# Patient Record
Sex: Male | Born: 1985
Health system: Southern US, Community
[De-identification: ages and names within clinical notes are randomized; demographics above are authoritative.]

## PROBLEM LIST (undated history)

## (undated) DIAGNOSIS — M25512 Pain in left shoulder: Secondary | ICD-10-CM

## (undated) DIAGNOSIS — Z9889 Other specified postprocedural states: Secondary | ICD-10-CM

## (undated) DIAGNOSIS — K219 Gastro-esophageal reflux disease without esophagitis: Secondary | ICD-10-CM

## (undated) DIAGNOSIS — G4730 Sleep apnea, unspecified: Secondary | ICD-10-CM

## (undated) DIAGNOSIS — R519 Headache, unspecified: Secondary | ICD-10-CM

## (undated) DIAGNOSIS — Z8719 Personal history of other diseases of the digestive system: Secondary | ICD-10-CM

## (undated) DIAGNOSIS — R51 Headache: Principal | ICD-10-CM

## (undated) HISTORY — PX: ESOPHAGEAL DILATION: SHX303

## (undated) HISTORY — DX: Personal history of other diseases of the digestive system: Z87.19

## (undated) HISTORY — PX: LIPOMA EXCISION: SHX5283

## (undated) HISTORY — PX: SHOULDER ARTHROSCOPY: SHX128

## (undated) HISTORY — DX: Headache, unspecified: R51.9

## (undated) HISTORY — DX: Pain in left shoulder: M25.512

## (undated) HISTORY — DX: Sleep apnea, unspecified: G47.30

## (undated) HISTORY — DX: Headache: R51

## (undated) HISTORY — DX: Gastro-esophageal reflux disease without esophagitis: K21.9

## (undated) HISTORY — DX: Other specified postprocedural states: Z98.890

---

## 2005-12-07 ENCOUNTER — Ambulatory Visit: Payer: Self-pay | Admitting: Urology

## 2006-01-09 ENCOUNTER — Emergency Department: Payer: Self-pay | Admitting: Emergency Medicine

## 2007-10-04 ENCOUNTER — Ambulatory Visit: Payer: Self-pay | Admitting: Internal Medicine

## 2007-10-16 ENCOUNTER — Ambulatory Visit: Payer: Self-pay | Admitting: Family Medicine

## 2009-10-22 ENCOUNTER — Ambulatory Visit: Payer: Self-pay | Admitting: Family Medicine

## 2011-02-08 ENCOUNTER — Ambulatory Visit: Payer: Self-pay | Admitting: Family Medicine

## 2012-03-08 ENCOUNTER — Other Ambulatory Visit: Payer: Self-pay | Admitting: Neurosurgery

## 2012-03-08 DIAGNOSIS — M5124 Other intervertebral disc displacement, thoracic region: Secondary | ICD-10-CM

## 2012-03-20 ENCOUNTER — Ambulatory Visit
Admission: RE | Admit: 2012-03-20 | Discharge: 2012-03-20 | Disposition: A | Discharge status: Home / Self Care | Payer: BC Managed Care – PPO | Source: Ambulatory Visit | Attending: Neurosurgery | Admitting: Neurosurgery

## 2012-03-20 VITALS — BP 144/77 | HR 82

## 2012-03-20 DIAGNOSIS — M5124 Other intervertebral disc displacement, thoracic region: Secondary | ICD-10-CM

## 2012-03-20 MED ORDER — ONDANSETRON HCL 4 MG/2ML IJ SOLN
4.0000 mg | Freq: Once | INTRAMUSCULAR | Status: AC
  Administered 2012-03-20: 4 mg via INTRAMUSCULAR

## 2012-03-20 MED ORDER — DIAZEPAM 5 MG PO TABS
10.0000 mg | ORAL_TABLET | Freq: Once | ORAL | Status: AC
  Administered 2012-03-20: 10 mg via ORAL

## 2012-03-20 MED ORDER — MEPERIDINE HCL 100 MG/ML IJ SOLN
75.0000 mg | Freq: Once | INTRAMUSCULAR | Status: AC
  Administered 2012-03-20: 75 mg via INTRAMUSCULAR

## 2012-03-20 MED ORDER — IOHEXOL 300 MG/ML  SOLN
10.0000 mL | Freq: Once | INTRAMUSCULAR | Status: AC | PRN
  Administered 2012-03-20: 10 mL via INTRATHECAL

## 2012-05-02 ENCOUNTER — Other Ambulatory Visit: Payer: Self-pay | Admitting: Internal Medicine

## 2012-05-02 DIAGNOSIS — R1310 Dysphagia, unspecified: Secondary | ICD-10-CM

## 2012-05-09 ENCOUNTER — Inpatient Hospital Stay: Admission: RE | Admit: 2012-05-09 | Payer: BC Managed Care – PPO | Source: Ambulatory Visit

## 2013-03-06 ENCOUNTER — Emergency Department: Payer: Self-pay | Admitting: Emergency Medicine

## 2013-03-06 LAB — CBC
HCT: 41.1 % (ref 40.0–52.0)
HGB: 14.6 g/dL (ref 13.0–18.0)
MCH: 32 pg (ref 26.0–34.0)
MCV: 90 fL (ref 80–100)
Platelet: 192 10*3/uL (ref 150–440)
RDW: 12.7 % (ref 11.5–14.5)
WBC: 9.3 10*3/uL (ref 3.8–10.6)

## 2013-03-06 LAB — BASIC METABOLIC PANEL
BUN: 12 mg/dL (ref 7–18)
EGFR (Non-African Amer.): >60
Glucose: 107 mg/dL — ABNORMAL HIGH (ref 65–99)
Osmolality: 278 (ref 275–301)
Potassium: 3.5 mmol/L (ref 3.5–5.1)

## 2013-03-06 LAB — URINALYSIS, COMPLETE
Bilirubin,UR: NEGATIVE
Leukocyte Esterase: NEGATIVE
Nitrite: NEGATIVE
Ph: 7 (ref 4.5–8.0)
Protein: NEGATIVE
RBC,UR: 9 /HPF (ref 0–5)
WBC UR: 1 /HPF (ref 0–5)

## 2013-04-07 ENCOUNTER — Emergency Department: Payer: Self-pay | Admitting: Emergency Medicine

## 2013-04-07 LAB — BASIC METABOLIC PANEL
BUN: 15 mg/dL (ref 7–18)
Calcium, Total: 9.9 mg/dL (ref 8.5–10.1)
EGFR (African American): >60
Osmolality: 283 (ref 275–301)

## 2013-04-07 LAB — URINALYSIS, COMPLETE
Bacteria: NONE SEEN
Leukocyte Esterase: NEGATIVE
RBC,UR: 3 /HPF (ref 0–5)
Specific Gravity: 1.025 (ref 1.003–1.030)
Squamous Epithelial: NONE SEEN
WBC UR: 2 /HPF (ref 0–5)

## 2013-04-07 LAB — CBC
HCT: 42.6 % (ref 40.0–52.0)
HGB: 15.1 g/dL (ref 13.0–18.0)
MCH: 31.9 pg (ref 26.0–34.0)
MCHC: 35.5 g/dL (ref 32.0–36.0)
Platelet: 186 10*3/uL (ref 150–440)
RBC: 4.74 10*6/uL (ref 4.40–5.90)
RDW: 12.4 % (ref 11.5–14.5)
WBC: 8.1 10*3/uL (ref 3.8–10.6)

## 2014-02-26 ENCOUNTER — Ambulatory Visit: Payer: Self-pay | Admitting: Neurosurgery

## 2015-06-16 ENCOUNTER — Ambulatory Visit: Payer: Self-pay | Admitting: Neurology

## 2015-07-01 ENCOUNTER — Encounter: Payer: Self-pay | Admitting: Neurology

## 2015-07-01 ENCOUNTER — Ambulatory Visit (INDEPENDENT_AMBULATORY_CARE_PROVIDER_SITE_OTHER): Payer: PRIVATE HEALTH INSURANCE | Admitting: Neurology

## 2015-07-01 VITALS — BP 139/87 | HR 97 | Ht 70.0 in | Wt 221.0 lb

## 2015-07-01 DIAGNOSIS — G4486 Cervicogenic headache: Secondary | ICD-10-CM

## 2015-07-01 DIAGNOSIS — R51 Headache: Secondary | ICD-10-CM | POA: Diagnosis not present

## 2015-07-01 HISTORY — DX: Cervicogenic headache: G44.86

## 2015-07-01 MED ORDER — GABAPENTIN 300 MG PO CAPS: 300.0000 mg | ORAL_CAPSULE | Freq: Two times a day (BID) | ORAL | Status: DC

## 2015-07-01 NOTE — Progress Notes (Signed)
Reason for visit: Headache  Referring physician: Dr. Martha Maldonado is a 30 y.o. male  History of present illness:  [name] is a 30 year old left-handed white male with a history of headaches that began about one year ago. The patient indicates that he has had some left shoulder discomfort, he was seen by Dr. Thurston Maldonado for this, he was not felt to have significant intrinsic shoulder joint disease. He has noted some discomfort in the paraspinal muscles on the left, with some crepitus in the neck. He reports discomfort in the left occipital area of the head, occasionally radiating into the left frontotemporal area. He will note some restriction of movement when he turns his head to the right, with a pulling sensation in the left neck. When he turns his head to the left for a prolonged period of time, this will significantly increase the pain in the left occipital region. He denies any nausea or vomiting with the headache, but he does have some dizziness and some blurring of vision at times. The headache may vary in severity from one day to the next. The headache is always there. He has been placed on low-dose Flexeril 5 mg at night with minimal benefit. He will take Tylenol on occasion. He denies any weakness of the extremities, he denies any gait disturbance or difficulty controlling the bowels or the bladder. He is sent to this office for an evaluation.  Past Medical History  Diagnosis Date  . Headache   . Cervicogenic headache 07/01/2015    Left side    Past Surgical History  Procedure Laterality Date  . Lipoma excision      Family History  Problem Relation Age of Onset  . Sleep apnea Mother   . Migraines Mother   . Diabetes Father   . Sleep apnea Father     Social history:  reports that he has been smoking Cigars.  He has never used smokeless tobacco. He reports that he does not drink alcohol or use illicit drugs.  Medications:  Prior to Admission medications     Medication Sig Start Date End Date Taking? Authorizing Provider  acetaminophen (TYLENOL) 500 MG tablet Take 1,000 mg by mouth every 6 (six) hours as needed.   Yes Historical Provider, MD  cyclobenzaprine (FLEXERIL) 5 MG tablet Take 5 mg by mouth 3 (three) times daily as needed for muscle spasms.   Yes Historical Provider, MD      Allergies  Allergen Reactions  . Sulfa Antibiotics     Unknown;  Had as a child and does not recall reaction    ROS:  Out of a complete 14 system review of symptoms, the patient complains only of the following symptoms, and all other reviewed systems are negative.  Blurred vision Headache, dizziness  Blood pressure 139/87, pulse 97, height 5\' 10"  (1.778 m), weight 221 lb (100.245 kg).  Physical Exam  General: The patient is alert and cooperative at the time of the examination.  Eyes: Pupils are equal, round, and reactive to light. Discs are flat bilaterally.  Neck: The neck is supple, no carotid bruits are noted.  Respiratory: The respiratory examination is clear.  Cardiovascular: The cardiovascular examination reveals a regular rate and rhythm, no obvious murmurs or rubs are noted.  Neuromuscular: The patient lacks about 15 of lateral rotation of the cervical spine to the right, relatively full movement to the left.  Skin: Extremities are without significant edema.  Neurologic Exam  Mental status: The  patient is alert and oriented x 3 at the time of the examination. The patient has apparent normal recent and remote memory, with an apparently normal attention span and concentration ability.  Cranial nerves: Facial symmetry is present. There is good sensation of the face to pinprick and soft touch bilaterally. The strength of the facial muscles and the muscles to head turning and shoulder shrug are normal bilaterally. Speech is well enunciated, no aphasia or dysarthria is noted. Extraocular movements are full. Visual fields are full. The tongue is  midline, and the patient has symmetric elevation of the soft palate. No obvious hearing deficits are noted.  Motor: The motor testing reveals 5 over 5 strength of all 4 extremities. Good symmetric motor tone is noted throughout.  Sensory: Sensory testing is intact to pinprick, soft touch, vibration sensation, and position sense on all 4 extremities, with exception of some slight decrease in vibration sensation on the right foot as compared to the left.. No evidence of extinction is noted.  Coordination: Cerebellar testing reveals good finger-nose-finger and heel-to-shin bilaterally.  Gait and station: Gait is normal. Tandem gait is normal. Romberg is negative. No drift is seen.  Reflexes: Deep tendon reflexes are symmetric and normal bilaterally. Toes are downgoing bilaterally.   Assessment/Plan:  1. Left cervicogenic headache.  The patient reports crepitus in the neck, and appears to have muscle soreness and tension in the left neck with a left occipital headache. This is fully consistent with a cervicogenic headache. The patient will be placed on gabapentin taking 300 mg twice daily, he will remain on the Flexeril 5 mg at night. We will set him up for physical therapy for neuromuscular therapy of the neck and shoulders. He will follow-up in 3 months. If the discomfort does not improve, MRI of the cervical spine may be done in the future.  Brian Maldonado. Brian Rexine Gowens MD 07/01/2015 9:03 PM  [facility] [address] [place], [place] [id number]  Phone [phone] Fax [phone]

## 2015-07-01 NOTE — Patient Instructions (Signed)
We will start gabapentin for the pain and get physical therapy. Continue the Flexeril for now.   Neuropathic Pain Neuropathic pain is pain caused by damage to the nerves that are responsible for certain sensations in your body (sensory nerves). The pain can be caused by damage to:   The sensory nerves that send signals to your spinal cord and brain (peripheral nervous system).  The sensory nerves in your brain or spinal cord (central nervous system). Neuropathic pain can make you more sensitive to pain. What would be a minor sensation for most people may feel very painful if you have neuropathic pain. This is usually a long-term condition that can be difficult to treat. The type of pain can differ from person to person. It may start suddenly (acute), or it may develop slowly and last for a long time (chronic). Neuropathic pain may come and go as damaged nerves heal or may stay at the same level for years. It often causes emotional distress, loss of sleep, and a lower quality of life. CAUSES  The most common cause of damage to a sensory nerve is diabetes. Many other diseases and conditions can also cause neuropathic pain. Causes of neuropathic pain can be classified as:  Toxic. Many drugs and chemicals can cause toxic damage. The most common cause of toxic neuropathic pain is damage from drug treatment for cancer (chemotherapy).  Metabolic. This type of pain can happen when a disease causes imbalances that damage nerves. Diabetes is the most common of these diseases. Vitamin B deficiency caused by long-term alcohol abuse is another common cause.  Traumatic. Any injury that cuts, crushes, or stretches a nerve can cause damage and pain. A common example is feeling pain after losing an arm or leg (phantom limb pain).  Compression-related. If a sensory nerve gets trapped or compressed for a long period of time, the blood supply to the nerve can be cut off.  Vascular. Many blood vessel diseases  can cause neuropathic pain by decreasing blood supply and oxygen to nerves.  Autoimmune. This type of pain results from diseases in which the body's defense system mistakenly attacks sensory nerves. Examples of autoimmune diseases that can cause neuropathic pain include lupus and multiple sclerosis.  Infectious. Many types of viral infections can damage sensory nerves and cause pain. Shingles infection is a common cause of this type of pain.  Inherited. Neuropathic pain can be a symptom of many diseases that are passed down through families (genetic). SIGNS AND SYMPTOMS  The main symptom is pain. Neuropathic pain is often described as:  Burning.  Shock-like.  Stinging.  Hot or cold.  Itching. DIAGNOSIS  No single test can diagnose neuropathic pain. Your health care provider will do a physical exam and ask you about your pain. You may use a pain scale to describe how bad your pain is. You may also have tests to see if you have a high sensitivity to pain and to help find the cause and location of any sensory nerve damage. These tests may include:  Imaging studies, such as:  X-rays.  CT scan.  MRI.  Nerve conduction studies to test how well nerve signals travel through your sensory nerves (electrodiagnostic testing).  Stimulating your sensory nerves through electrodes on your skin and measuring the response in your spinal cord and brain (somatosensory evoked potentials). TREATMENT  Treatment for neuropathic pain may change over time. You may need to try different treatment options or a combination of treatments. Some options include:  Over-the-counter pain relievers.  Prescription medicines. Some medicines used to treat other conditions may also help neuropathic pain. These include medicines to:  Control seizures (anticonvulsants).  Relieve depression (antidepressants).  Prescription-strength pain relievers (narcotics). These are usually used when other pain relievers do not  help.  Transcutaneous nerve stimulation (TENS). This uses electrical currents to block painful nerve signals. The treatment is painless.  Topical and local anesthetics. These are medicines that numb the nerves. They can be injected as a nerve block or applied to the skin.  Alternative treatments, such as:  Acupuncture.  Meditation.  Massage.  Physical therapy.  Pain management programs.  Counseling. HOME CARE INSTRUCTIONS  Learn as much as you can about your condition.  Take medicines only as directed by your health care provider.  Work closely with all your health care providers to find what works best for you.  Have a good support system at home.  Consider joining a chronic pain support group. SEEK MEDICAL CARE IF:  Your pain treatments are not helping.  You are having side effects from your medicines.  You are struggling with fatigue, mood changes, depression, or anxiety.   This information is not intended to replace advice given to you by your health care provider. Make sure you discuss any questions you have with your health care provider.   Document Released: 03/11/2004 Document Revised: 07/05/2014 Document Reviewed: 11/22/2013 Elsevier Interactive Patient Education [employer].

## 2015-07-31 ENCOUNTER — Ambulatory Visit: Payer: PRIVATE HEALTH INSURANCE

## 2015-10-14 ENCOUNTER — Ambulatory Visit: Payer: PRIVATE HEALTH INSURANCE | Admitting: Neurology

## 2015-11-02 ENCOUNTER — Other Ambulatory Visit: Payer: Self-pay | Admitting: Neurology

## 2015-11-25 ENCOUNTER — Telehealth: Payer: Self-pay | Admitting: Neurology

## 2015-11-25 MED ORDER — GABAPENTIN 300 MG PO CAPS: 300.0000 mg | ORAL_CAPSULE | Freq: Two times a day (BID) | ORAL | Status: DC

## 2015-11-25 NOTE — Telephone Encounter (Signed)
Spoke to pt. Reports that his wife recently gave birth and he had to cancel his appt in April. Says that gabapentin is working very well. Retailed w/ 1 refill. Pt agreed to call back to schedule 6 mo f/u appt in July.

## 2015-11-25 NOTE — Telephone Encounter (Signed)
Message For: OFC                  Taken 30-MAY-17 at 11:14AM by KAF ------------------------------------------------------------  [name] , Brian             CID  [id number]   Patient  SAME                  Pt's Dr  Anne HahnWILLIS        Area Code  336  Phone#  [phone] *  DOB  [birth year]      RE  NEEDS RX REFILL / PCB 5/30                                                                              Disp:Y/N  N  If Y = C/B If No Response In 20minutes  ============================================================

## 2016-06-01 ENCOUNTER — Encounter: Payer: Self-pay | Admitting: Family Medicine

## 2016-06-01 ENCOUNTER — Ambulatory Visit (INDEPENDENT_AMBULATORY_CARE_PROVIDER_SITE_OTHER): Payer: 59 | Admitting: Family Medicine

## 2016-06-01 VITALS — BP 118/80 | HR 77 | Temp 98.7°F | Ht 70.0 in | Wt 217.5 lb

## 2016-06-01 DIAGNOSIS — Z8719 Personal history of other diseases of the digestive system: Secondary | ICD-10-CM

## 2016-06-01 DIAGNOSIS — M25512 Pain in left shoulder: Secondary | ICD-10-CM | POA: Diagnosis not present

## 2016-06-01 DIAGNOSIS — G5600 Carpal tunnel syndrome, unspecified upper limb: Secondary | ICD-10-CM

## 2016-06-01 DIAGNOSIS — G4486 Cervicogenic headache: Secondary | ICD-10-CM

## 2016-06-01 DIAGNOSIS — Z7189 Other specified counseling: Secondary | ICD-10-CM | POA: Insufficient documentation

## 2016-06-01 DIAGNOSIS — G8929 Other chronic pain: Secondary | ICD-10-CM

## 2016-06-01 DIAGNOSIS — Z23 Encounter for immunization: Secondary | ICD-10-CM | POA: Diagnosis not present

## 2016-06-01 DIAGNOSIS — Z9889 Other specified postprocedural states: Secondary | ICD-10-CM

## 2016-06-01 DIAGNOSIS — R51 Headache: Secondary | ICD-10-CM

## 2016-06-01 MED ORDER — OMEPRAZOLE 20 MG PO CPDR: 20.0000 mg | DELAYED_RELEASE_CAPSULE | Freq: Every day | ORAL | 3 refills | Status: DC

## 2016-06-01 MED ORDER — TRAMADOL HCL 50 MG PO TABS: 50.0000 mg | ORAL_TABLET | Freq: Four times a day (QID) | ORAL | 1 refills | Status: DC | PRN

## 2016-06-01 NOTE — Patient Instructions (Signed)
[name] will call about your referral. Take care.  Glad to see you.  I'll review your old records.
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
## 2016-06-01 NOTE — Progress Notes (Signed)
Pre visit review using our clinic review tool, if applicable. No additional management support is needed unless otherwise documented below in the visit note. 
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
## 2016-06-02 ENCOUNTER — Encounter: Payer: Self-pay | Admitting: Family Medicine

## 2016-06-02 DIAGNOSIS — Z8719 Personal history of other diseases of the digestive system: Secondary | ICD-10-CM | POA: Insufficient documentation

## 2016-06-02 DIAGNOSIS — Z9889 Other specified postprocedural states: Secondary | ICD-10-CM

## 2016-06-02 DIAGNOSIS — M25512 Pain in left shoulder: Secondary | ICD-10-CM | POA: Insufficient documentation

## 2016-06-02 DIAGNOSIS — G5600 Carpal tunnel syndrome, unspecified upper limb: Secondary | ICD-10-CM | POA: Insufficient documentation

## 2016-06-02 NOTE — Assessment & Plan Note (Signed)
Continue PPI. No adverse effect on medication. Able to swallow well now.

## 2016-06-02 NOTE — Assessment & Plan Note (Addendum)
See above. I'm concerned for rotator cuff versus labral pathology. Discussed with patient. Refer back to orthopedics. Continue tramadol as needed. He agrees. >30 minutes spent in face to face time with patient, >50% spent in counselling or coordination of care.

## 2016-06-02 NOTE — Assessment & Plan Note (Signed)
I will review old records.

## 2016-06-02 NOTE — Assessment & Plan Note (Signed)
Likely diagnosis. Reasonable to continue splints as needed. Continue B12 as this may help some. Update me if worse. He agrees.

## 2016-06-16 ENCOUNTER — Other Ambulatory Visit: Payer: Self-pay | Admitting: Family Medicine

## 2016-06-16 NOTE — Telephone Encounter (Signed)
Please call in.  Thanks.   

## 2016-06-16 NOTE — Telephone Encounter (Signed)
Received refill request electronically Last refill 06/01/16 #30/1 Last office visit same date Called patient and was advised that he did request the refill. Patient stated that he was to see an orthopedist today, but appointment got rescheduled.

## 2016-06-16 NOTE — Telephone Encounter (Signed)
Rx called to pharmacy as instructed. 

## 2016-06-28 ENCOUNTER — Other Ambulatory Visit: Payer: Self-pay | Admitting: Family Medicine

## 2016-06-28 DIAGNOSIS — G4730 Sleep apnea, unspecified: Secondary | ICD-10-CM

## 2016-06-28 HISTORY — DX: Sleep apnea, unspecified: G47.30

## 2016-06-29 ENCOUNTER — Other Ambulatory Visit: Payer: Self-pay | Admitting: Family Medicine

## 2016-06-29 NOTE — Telephone Encounter (Signed)
Electronic refill request. Last Filled:    30 tablet 1 06/16/2016  Please advise.

## 2016-06-30 NOTE — Telephone Encounter (Signed)
Spoke to patient by telephone and was advised that he did request the refill. Patient stated that he has an appointment scheduled with orthopedist Dr. Thurston HoleWainer today at 2:00. Patient stated he was not sure if they would given him anything for pain since you have been giving it to him?

## 2016-06-30 NOTE — Telephone Encounter (Signed)
See other note. Please call in if not already done.  Thanks.   Will await ortho notes.

## 2016-06-30 NOTE — Telephone Encounter (Signed)
                                                                                                                                                                                                                                                                                                                                                                                                                                                                                                                                                                                             +                                                                                                                                                                                                                                                                                                                                                                                                                                                                                                                                                                                                                                                                                                                                                                                                                                                                                                                                                                                                                                                                                                                                                                                                                                                                                                                                                                                                                                                                                                                                                                                                                                                                                          +                                                                                                                                                                                                                                                                                                                                                                                                                                                                                                                                                                                                                                                                                                                                                                                                                                                                                                                                                                                                                                                                                                                                Spoke to patient by telephone and was advised that he did request the refill.  Patient stated that he has an appointment with orthopedist Dr. Thurston HoleWainer today at 2:00. Patient stated that he did not know if they would give him something for pain since you have been giving him pain medication?

## 2016-06-30 NOTE — Telephone Encounter (Signed)
See other refill request.  Thanks.

## 2016-06-30 NOTE — Telephone Encounter (Signed)
Please call in if not already done and I'll await the ortho notes.  Thanks.

## 2016-06-30 NOTE — Telephone Encounter (Signed)
Pt left v/m requesting cb with status of tramadol refill. 

## 2016-06-30 NOTE — Telephone Encounter (Signed)
Already called to pharmacy as instructed.

## 2016-06-30 NOTE — Telephone Encounter (Signed)
See other note. Patient did request refill. Has appointment scheduled with orthopedist today.

## 2016-06-30 NOTE — Telephone Encounter (Signed)
Rx called to pharmacy as instructed. Patient notified by telephone that script has been called in. 

## 2016-06-30 NOTE — Telephone Encounter (Signed)
Please call in after verifying with patient that this wasn't an auto refill request.   Please get update on patient re: pain since he has needed refill.  Thanks.

## 2016-06-30 NOTE — Telephone Encounter (Signed)
Last refill 06/16/16 #30 +1, last OV 06/01/16. OK to refill?

## 2016-07-12 ENCOUNTER — Other Ambulatory Visit: Payer: Self-pay | Admitting: Family Medicine

## 2016-07-12 NOTE — Telephone Encounter (Signed)
Received refill request electronically Last refill 06/30/16 #30/1 refill Last office visit 06/01/16

## 2016-07-12 NOTE — Telephone Encounter (Signed)
This looks a little early.  How is patient doing?  Let me know.  Thanks.

## 2016-07-13 NOTE — Telephone Encounter (Signed)
Medication phoned to pharmacy.  
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
## 2016-07-13 NOTE — Telephone Encounter (Signed)
Please call in.  Thanks.   

## 2016-07-25 ENCOUNTER — Other Ambulatory Visit: Payer: Self-pay | Admitting: Family Medicine

## 2016-07-25 NOTE — Telephone Encounter (Signed)
Last office visit 06/01/16.  Last refilled 07/13/16 for #30 with 1 refill.  Ok to refill?

## 2016-07-26 NOTE — Telephone Encounter (Signed)
Call in.  I need notes from ortho.  Patient was to see them in the meantime.  Thanks.

## 2016-07-26 NOTE — Telephone Encounter (Signed)
Rx called to pharmacy as instructed. Patient notified by telephone. Spoke to BrisbinMarla at Dr. Sherene SiresWainer's office and requested that office notes be faxed to Dr. Para Marchuncan.

## 2016-07-26 NOTE — Telephone Encounter (Signed)
Pt left v/m requesting cb about tramadol refill; pt is going for MRI on 07/28/16 and will f/u with Dr Thurston HoleWainer

## 2016-07-26 NOTE — Telephone Encounter (Signed)
Thanks

## 2016-07-26 NOTE — Telephone Encounter (Signed)
Office notes on your desk from orthopedist.

## 2016-08-07 ENCOUNTER — Other Ambulatory Visit: Payer: Self-pay | Admitting: Family Medicine

## 2016-08-07 NOTE — Telephone Encounter (Signed)
Last office visit 06/01/16.  Last refilled 07/26/16 fir #30 with 1 refill.  Refill?

## 2016-08-08 NOTE — Telephone Encounter (Signed)
Please call in.  How did his MRI with ortho go re: his shoulder pain? Thanks.

## 2016-08-09 NOTE — Telephone Encounter (Signed)
Pt left v/m requesting refill tramadol; pt said orthopedic doctor told pt he has a torn rotator cuff; was given antiinflammatory which helps pain slightly. Still has dull pain. Pt will start PT soon. Pt is hopeful will not need surgery. FYI to Dr Para Marchuncan.

## 2016-08-09 NOTE — Telephone Encounter (Signed)
Rx called to pharmacy as instructed. 

## 2016-08-10 NOTE — Telephone Encounter (Signed)
Noted. Thanks.

## 2016-08-22 ENCOUNTER — Other Ambulatory Visit: Payer: Self-pay | Admitting: Family Medicine

## 2016-08-22 NOTE — Telephone Encounter (Signed)
Please call in.  Thanks.   

## 2016-08-22 NOTE — Telephone Encounter (Signed)
Last office visit 12/517.  Last refilled 08/08/16 for #30 with 1 refill.  Ok to refill?

## 2016-08-23 NOTE — Telephone Encounter (Signed)
Rx called to pharmacy as instructed. 

## 2016-09-04 ENCOUNTER — Other Ambulatory Visit: Payer: Self-pay | Admitting: Family Medicine

## 2016-09-04 NOTE — Telephone Encounter (Signed)
Last office visit 06/01/2016.  Last refilled 08/22/2016 for # 30 with 1 refill?  Ok to refill?

## 2016-09-05 NOTE — Telephone Encounter (Signed)
Please call in.  Thanks.   

## 2016-09-06 NOTE — Telephone Encounter (Signed)
Please call patient when rx is called in to pharmacy at [phone].

## 2016-09-06 NOTE — Telephone Encounter (Signed)
Rx called to pharmacy as instructed. Called patient and was advised that he is only getting one weeks worth at a time. Patient stated that he is having physical therapy and has a follow-up scheduled with his orthopedist coming up. Patient stated that it would be good if when he needs a refill the next time if Dr. Para Marchuncan can give him more than #30 at a time. Patient stated that he will call the office the next time before he needs a refill.

## 2016-09-08 NOTE — Telephone Encounter (Signed)
Patient notified as instructed by telephone and verbalized understanding. 

## 2016-09-08 NOTE — Telephone Encounter (Signed)
Noted. Thanks. Agreed.  Will await the f/u notes with ortho.  I hope that PT is going well.

## 2016-09-15 ENCOUNTER — Other Ambulatory Visit: Payer: Self-pay

## 2016-09-15 MED ORDER — TRAMADOL HCL 50 MG PO TABS: ORAL_TABLET | ORAL | 1 refills | Status: DC

## 2016-09-15 NOTE — Telephone Encounter (Signed)
Pt left v/m requesting 30 day rx instead of 7 day rx for tramadol. Last refill #30 x 1 on 09/05/16. Pt last seen 06/01/16.( I have not changed quantity till approved by Dr Para Marchuncan).

## 2016-09-15 NOTE — Telephone Encounter (Signed)
Please call in.  Thanks.   

## 2016-09-15 NOTE — Telephone Encounter (Signed)
[facility] : [facility] [place] [id number]: [phone]

## 2016-11-01 ENCOUNTER — Other Ambulatory Visit: Payer: Self-pay | Admitting: Family Medicine

## 2016-11-02 NOTE — Telephone Encounter (Signed)
Electronic refill request. Last office visit:   06/01/16 Last Filled:    120 tablet 1 09/15/2016  Please advise.

## 2016-11-03 ENCOUNTER — Telehealth: Payer: Self-pay

## 2016-11-03 NOTE — Telephone Encounter (Signed)
Pt request refill omeprazole; pt looked at his omeprazole bottle and still has 2 refills; pt will ck with pharmacy.

## 2016-11-03 NOTE — Telephone Encounter (Signed)
Please call in.  Thanks.  When is he seeing ortho again?

## 2016-11-03 NOTE — Telephone Encounter (Signed)
Rx called to pharmacy as instructed.  Spoke to patient and was advised that he has another appointment with orthopedist in June, but he is at work and does not remember the date. Patient stated that he had one scheduled last month, but had to cancel it because of his work schedule. Patient stated that [place] and it [place] get the [place]. Patient stated that the appointment is scheduled in June because that is the first opening that the [place]oped[place] has [place] the [place].

## 2016-11-03 NOTE — Telephone Encounter (Signed)
Noted. Thanks.

## 2016-12-27 ENCOUNTER — Other Ambulatory Visit: Payer: Self-pay | Admitting: *Deleted

## 2016-12-27 ENCOUNTER — Other Ambulatory Visit: Payer: Self-pay | Admitting: Family Medicine

## 2016-12-27 MED ORDER — TRAMADOL HCL 50 MG PO TABS: ORAL_TABLET | ORAL | 0 refills | Status: DC

## 2016-12-27 NOTE — Telephone Encounter (Signed)
Patient left a voicemail requesting a refill on Tramadol. Patient stated that he had an appointment scheduled with the orthopedist and the office called and had to reschedule it for 2 weeks because the doctor had surgeries that he had to do. Patient stated that his appointment has been rescheduled for 2 weeks and needs a refill on medication to last until his appointment. Last refilll 11/03/16 #120/1 Last office visit 06/01/16

## 2016-12-27 NOTE — Telephone Encounter (Signed)
Please call in.  But needs OV here in the meantime, since we haven't seen him since 05/2016 And I need recent notes from ortho.  Thanks.

## 2016-12-28 NOTE — Telephone Encounter (Signed)
Phoned in Rx.

## 2016-12-28 NOTE — Telephone Encounter (Signed)
Faxed for recent notes from Ortho.  Patient advised to make appointment.

## 2017-01-17 ENCOUNTER — Encounter: Payer: Self-pay | Admitting: Family Medicine

## 2017-01-17 ENCOUNTER — Ambulatory Visit (INDEPENDENT_AMBULATORY_CARE_PROVIDER_SITE_OTHER): Payer: 59 | Admitting: Family Medicine

## 2017-01-17 VITALS — BP 102/66 | HR 65 | Temp 98.6°F | Ht 70.0 in | Wt 208.0 lb

## 2017-01-17 DIAGNOSIS — Z0000 Encounter for general adult medical examination without abnormal findings: Secondary | ICD-10-CM | POA: Insufficient documentation

## 2017-01-17 DIAGNOSIS — Z13220 Encounter for screening for lipoid disorders: Secondary | ICD-10-CM

## 2017-01-17 DIAGNOSIS — Z131 Encounter for screening for diabetes mellitus: Secondary | ICD-10-CM | POA: Diagnosis not present

## 2017-01-17 DIAGNOSIS — Z0001 Encounter for general adult medical examination with abnormal findings: Secondary | ICD-10-CM

## 2017-01-17 DIAGNOSIS — B001 Herpesviral vesicular dermatitis: Secondary | ICD-10-CM | POA: Insufficient documentation

## 2017-01-17 DIAGNOSIS — G8929 Other chronic pain: Secondary | ICD-10-CM

## 2017-01-17 DIAGNOSIS — M25512 Pain in left shoulder: Secondary | ICD-10-CM

## 2017-01-17 LAB — LIPID PANEL
Cholesterol: 164 mg/dL (ref 0–200)
HDL: 34.3 mg/dL — AB (ref >39.00)
LDL Cholesterol: 102 mg/dL — ABNORMAL HIGH (ref 0–99)
NONHDL: 130.14
Total CHOL/HDL Ratio: 5
Triglycerides: 141 mg/dL (ref 0.0–149.0)
VLDL: 28.2 mg/dL (ref 0.0–40.0)

## 2017-01-17 LAB — GLUCOSE, RANDOM: Glucose, Bld: 108 mg/dL — ABNORMAL HIGH (ref 70–99)

## 2017-01-17 MED ORDER — TRAMADOL HCL 50 MG PO TABS: ORAL_TABLET | ORAL | 2 refills | Status: DC

## 2017-01-17 MED ORDER — VALACYCLOVIR HCL 1 G PO TABS: 1000.0000 mg | ORAL_TABLET | Freq: Two times a day (BID) | ORAL | Status: DC

## 2017-01-17 NOTE — Assessment & Plan Note (Signed)
He has rx for valtrex.  D/w pt.

## 2017-01-17 NOTE — Assessment & Plan Note (Addendum)
Tetanus 2016.   Flu prev done.   PNA and shingles not due Colon and prostate cancer screening not due.  Living will d/w pt.  Wife designated if patient were incapacitated.   Diet and exercise d/w pt.  Some cardio, exercise with weights is limited by shoulder pain.  D/w pt about diet, he is on diet. Intentional weight loss noted.  D/w pt.   Labs pending, routine lipid and sugar pending.  D/w pt.

## 2017-01-17 NOTE — Assessment & Plan Note (Addendum)
Tramadol refill done, he has sig pain with ROM.  Has f/u with ortho pending.  D/w pt.  It looks like he has L chest wall pain, reproduced on exam.  He may have some concurrent Nsaid related gerd, would continue ppi, d/w pt.  He agrees.  No sign of ominous dx, d/w pt.

## 2017-01-17 NOTE — Progress Notes (Signed)
CPE- See plan.  Routine anticipatory guidance given to patient.  See health maintenance.  The possibility exists that previously documented standard health maintenance information may have been brought forward from a previous encounter into this note.  If needed, that same information has been updated to reflect the current situation based on today's encounter.    Tetanus 2016.   Flu prev done.   PNA and shingles not due Colon and prostate cancer screening not due.  Living will d/w pt.  Wife designated if patient were incapacitated.   Diet and exercise d/w pt.  Some cardio, exercise with weights is limited by shoulder pain.  D/w pt about diet, he is on diet. Intentional weight loss noted.  D/w pt.   Labs pending.   He is following up with ortho tomorrow, s/p injection, he has been doing home exercise program.  He is still having a lot of shoulder pain.  He is carrying his daughter some (6714 month old daughter), he is an active parent, and that likely contributes.  He is doing the best he can with his situation.  He is still having some headaches, likely exacerbated by the shoulder pain.  No ADE on med.  He has had some L chest wall pain that is radiation up to the L shoulder, not exertional.  Can happen at rest, under periods of high stress, sometimes with meals.  On nsaid at baseline, d/w pt.  His chest wall can also be ttp with episodes.    PMH and SH reviewed  Meds, vitals, and allergies reviewed.   ROS: Per HPI.  Unless specifically indicated otherwise in HPI, the patient denies:  General: fever. Eyes: acute vision changes ENT: sore throat Cardiovascular: chest pain Respiratory: SOB GI: vomiting GU: dysuria Musculoskeletal: acute back pain Derm: acute rash Neuro: acute motor dysfunction Psych: worsening mood Endocrine: polydipsia Heme: bleeding Allergy: hayfever  GEN: nad, alert and oriented HEENT: mucous membranes moist, cold sore noted on the R upper lip, has tx with valtrex  per dentist NECK: supple w/o LA CV: rrr. PULM: ctab, no inc wob ABD: soft, +bs EXT: no edema SKIN: no acute rash Sig pain with L shoulder extension and abduction >30 deg from his side.

## 2017-01-17 NOTE — Addendum Note (Signed)
Addended by: Liane ComberHAVERS, Tiaira Arambula C on: 01/17/2017 11:12 AM   Modules accepted: Orders

## 2017-01-17 NOTE — Patient Instructions (Signed)
Go to the lab on the way out.  We'll contact you with your lab report. Take care.  Glad to see you.  Update me as needed.  I'll await the ortho notes.

## 2017-01-18 ENCOUNTER — Telehealth: Payer: Self-pay

## 2017-01-18 ENCOUNTER — Encounter: Payer: Self-pay | Admitting: *Deleted

## 2017-01-18 NOTE — Telephone Encounter (Signed)
Pt saw Dr Para Marchuncan on 01/17/17; pt was offered stronger pain med for torn rotator cuff of lt shoulder but pt declined thought the tramadol was doing ok; Today pt went to ortho and was put thru the ringer and having more pain now; pt request stronger med for pain. Call pt when rx ready for pick up. Pt has appt for surgery on 02/25/17. Pt request cb.

## 2017-01-18 NOTE — Telephone Encounter (Signed)
I don't recall a specific offer- the point was to see if his pain was manageable with the tramadol.  If the pain is that much worse, especially after ortho exam, then I need the change to come though ortho clinic.

## 2017-01-18 NOTE — Telephone Encounter (Signed)
Patient advised.

## 2017-03-18 ENCOUNTER — Telehealth: Payer: Self-pay

## 2017-03-18 NOTE — Telephone Encounter (Signed)
Pt left v/m; pt had shoulder surgery with Delbert Harness and pt still in PT; tramadol does not help pain especially when have PT and ortho said can no longer fill hydrocodone 5 mg and should contact PCP. Pt said had been filled once or twice with ortho and they do not prescribe Hydrocodone for chronic issues. Pt request cb.annual exam on 01/17/17.

## 2017-03-18 NOTE — Telephone Encounter (Signed)
I'll await input from ortho.  I didn't rx anything in the meantime. Thanks.

## 2017-03-18 NOTE — Telephone Encounter (Signed)
I need a conversation with ortho about this first.  Ortho didn't appraise me of this.   See who you can get on the phone from ortho and find out what their plan is.  Thanks.

## 2017-03-18 NOTE — Telephone Encounter (Signed)
Left message at Dr. Sherene Sires office asking for a return call to Dr. Para March concerning this patient's care.

## 2017-03-18 NOTE — Telephone Encounter (Signed)
Patient called called.  Please call patient back at [phone].

## 2017-03-21 NOTE — Telephone Encounter (Signed)
Patient advised.

## 2017-03-21 NOTE — Telephone Encounter (Signed)
Patient returned Brian Maldonado's call.  Patient asked to be called back before 5:00.

## 2017-03-21 NOTE — Telephone Encounter (Addendum)
He is on the max dose of tramadol I will rx.  Would still use mobic, tylenol, and cut the muscle relaxer in half.  That is the best option I have for him.  Or he can see a pain clinic or f/u with ortho.  Thanks.

## 2017-03-21 NOTE — Telephone Encounter (Signed)
Pt returned lugene call [facility] number [phone]  Pt made appointment with dr Para March 10/1  He still would like a call back about rx

## 2017-03-21 NOTE — Telephone Encounter (Signed)
Called back this AM at 8AM but couldn't leave a message and on call service didn't pick up.  Called back at 15:22 PM.    Discussed the plan with with ortho Tomasa Rand).  Given his time from surgery, he is at the point of weaning off/stopping hydrocodone.  Can add tylenol  QID to tramadol if needed.  Can also take meloxicam daily with food.  The goal is to wean down on the tramadol to TID then BID then off med totally, over the next ~6-8 weeks, potentially sooner.  If he can't tolerate this and continues to have severe pain then we need to set him up with a pain clinic.

## 2017-03-21 NOTE — Telephone Encounter (Addendum)
Patient advised.   Patient says he has a refill of Tramadol remaining but he can't get it filled until Friday because he has had to take up to 4 per day.  Patient says he has had to sleep in a recliner and has muscle relaxers but they make him so drowsy, he can't work.  Patient states that if he could get a higher dose of Tramadol or something just to last until his appointment on Monday, October 1st, he would then talk to you about his options.

## 2017-03-28 ENCOUNTER — Ambulatory Visit (INDEPENDENT_AMBULATORY_CARE_PROVIDER_SITE_OTHER)
Admission: RE | Admit: 2017-03-28 | Discharge: 2017-03-28 | Disposition: A | Discharge status: Home / Self Care | Payer: 59 | Source: Ambulatory Visit | Attending: Family Medicine | Admitting: Family Medicine

## 2017-03-28 ENCOUNTER — Ambulatory Visit (INDEPENDENT_AMBULATORY_CARE_PROVIDER_SITE_OTHER): Payer: 59 | Admitting: Family Medicine

## 2017-03-28 ENCOUNTER — Encounter: Payer: Self-pay | Admitting: Family Medicine

## 2017-03-28 VITALS — BP 134/80 | HR 103 | Temp 97.8°F | Wt 207.8 lb

## 2017-03-28 DIAGNOSIS — M542 Cervicalgia: Secondary | ICD-10-CM

## 2017-03-28 MED ORDER — GABAPENTIN 100 MG PO CAPS: 100.0000 mg | ORAL_CAPSULE | Freq: Three times a day (TID) | ORAL | 1 refills | Status: DC | PRN

## 2017-03-28 NOTE — Progress Notes (Signed)
F/u re: shoulder pain.  Had surgery prior with ortho.  He is still in pain per his report.  Still in PT per ortho.  Had been on tramadol per my rx.    He is still having neck pain.  He wanted to address the neck issue.  No R arm pain.  Pain comes from the L occiput laterally and then down to the L elbow.  He is occ numb in the hands B, and has been using braces on his wrist and taking B12.  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    Meds, vitals, and allergies reviewed.   ROS: Per HPI unless specifically indicated in ROS section   GEN: nad, alert and oriented HEENT: mucous membranes moist NECK: supple w/o LA, not ttp in midline or R side but ttp on the L side posteriorly SKIN: no acute rash Normal L shoulder ROM Normal DTRs BUE S/S grossly wnl BUE L tinel positive.

## 2017-03-28 NOTE — Patient Instructions (Signed)
Go to the lab on the way out.  We'll contact you with your xray report. Increase the meloxicam to twice a day for now and take gabapentin if needed.  Take care.  Glad to see you.

## 2017-03-29 DIAGNOSIS — M542 Cervicalgia: Secondary | ICD-10-CM | POA: Insufficient documentation

## 2017-03-29 NOTE — Assessment & Plan Note (Signed)
I did not refill his tramadol early. Appears to have radicular left neck pain moving into the left arm. Okay to take 15 mg of Mobic per day. GI caution. Add on gabapentin has listed, 100 mg up to 3 times a day. Check imaging today. Negative x-ray. Will set up MRI. If positive refer to neurosurgery.

## 2017-04-05 ENCOUNTER — Ambulatory Visit
Admission: RE | Admit: 2017-04-05 | Discharge: 2017-04-05 | Disposition: A | Discharge status: Home / Self Care | Payer: 59 | Source: Ambulatory Visit | Attending: Family Medicine | Admitting: Family Medicine

## 2017-04-05 DIAGNOSIS — M542 Cervicalgia: Secondary | ICD-10-CM | POA: Diagnosis present

## 2017-04-05 DIAGNOSIS — M50223 Other cervical disc displacement at C6-C7 level: Secondary | ICD-10-CM | POA: Diagnosis not present

## 2017-04-05 DIAGNOSIS — M2578 Osteophyte, vertebrae: Secondary | ICD-10-CM | POA: Insufficient documentation

## 2017-04-05 DIAGNOSIS — M4802 Spinal stenosis, cervical region: Secondary | ICD-10-CM | POA: Diagnosis not present

## 2017-04-18 ENCOUNTER — Encounter: Payer: Self-pay | Admitting: Family Medicine

## 2017-04-18 ENCOUNTER — Ambulatory Visit (INDEPENDENT_AMBULATORY_CARE_PROVIDER_SITE_OTHER): Payer: 59 | Admitting: Family Medicine

## 2017-04-18 VITALS — BP 118/76 | HR 88 | Temp 98.7°F | Wt 209.0 lb

## 2017-04-18 DIAGNOSIS — Z23 Encounter for immunization: Secondary | ICD-10-CM | POA: Diagnosis not present

## 2017-04-18 DIAGNOSIS — M542 Cervicalgia: Secondary | ICD-10-CM

## 2017-04-18 DIAGNOSIS — R0683 Snoring: Secondary | ICD-10-CM

## 2017-04-18 MED ORDER — GABAPENTIN 100 MG PO CAPS: 100.0000 mg | ORAL_CAPSULE | Freq: Three times a day (TID) | ORAL | 1 refills | Status: DC | PRN

## 2017-04-18 MED ORDER — TRAMADOL HCL 50 MG PO TABS: ORAL_TABLET | ORAL | 0 refills | Status: DC

## 2017-04-18 NOTE — Progress Notes (Signed)
He has f/u with counseling pending.   He has f/u with the spine clinic- pending for 05/05/17.  Gabapentin is helping with neck pain.  He has used less and less tramadol in the meantime.  He is taking gabapentin 1 AM, 1 midday, and 1-2 at night. That helps.  He can tell a change/improvement but still with pain and the tramadol helps.    She has shoulder f/u with ortho on 05/04/17.  Shoulder ROM is better and his pain is better in the meantime.    Asking about sleep eval.  "It's always been a thing" and he wanted eval.  Snoring loudly, off and on when he is on his side, all the time when on his back.  Wife noted that he was trying to catch his breath while asleep.  Waking up tired in the AM.  He has some AM headaches which could be related to his neck/shoulder.  No known true apneas.  He wakes up a lot from sleep, unclear if from OSA.  Both parents have OSA.  Wife noted his sx to be worse over the last year.  He is trying to lose weight but w/o much change in sx.    Recent URI sx but resolving in the meantime.  Some nasal congestion but no temps >100.4 and he is feeling better in the meantime.   Meds, vitals, and allergies reviewed.   ROS: Per HPI unless specifically indicated in ROS section   TM wnl OP wnl Neck supple but B paraspinal muscles ttp  No rash No LA rrr ctab S/S and DTRs wnl BUE

## 2017-04-18 NOTE — Assessment & Plan Note (Addendum)
Initial tramadol rx printed but shredded and then corrected/reprinted and given to patient.   He is clearly improved based on his report with gabapentin. Okay to gradually increase the gabapentin dose in the meantime, slowly increasing up to 300 mg 3 times a day as needed. Routine cautions given. I gave him prescription for a small amount of tramadol and I'll to use as needed otherwise. He is tapering down with that medication as he is increasing gabapentin. He has follow-up with the spine clinic. I will await their notes. >25 minutes spent in face to face time with patient, >50% spent in counselling or coordination of care.

## 2017-04-18 NOTE — Patient Instructions (Addendum)
[name] will call about your referral. Take care.  Glad to see you.  I'll await the ortho and spine clinic notes.   Thanks for getting a flu shot.   Finish weaning down on the tramadol and take up to 3 gabapentin at a time.

## 2017-04-19 DIAGNOSIS — R0683 Snoring: Secondary | ICD-10-CM | POA: Insufficient documentation

## 2017-04-19 NOTE — Assessment & Plan Note (Signed)
Concern for obstructive sleep apnea. Has significant snoring. OSA pathophysiology discussed with patient. Refer for testing.

## 2017-04-20 ENCOUNTER — Ambulatory Visit (INDEPENDENT_AMBULATORY_CARE_PROVIDER_SITE_OTHER): Payer: 59 | Admitting: Internal Medicine

## 2017-04-20 ENCOUNTER — Encounter: Payer: Self-pay | Admitting: Internal Medicine

## 2017-04-20 VITALS — BP 130/72 | HR 102 | Resp 16 | Ht 70.0 in | Wt 212.0 lb

## 2017-04-20 DIAGNOSIS — G4719 Other hypersomnia: Secondary | ICD-10-CM | POA: Diagnosis not present

## 2017-04-20 NOTE — Progress Notes (Signed)
[facility] Consultation      Assessment and Plan:  31 yo male with symtoms and signs of obstructive sleep apnea.   Excessive daytime sleepiness, with snoring, gasping, morning headaches which are suspicious for obstructive sleep apnea..  -We will send for sleep study.      Date: 04/20/2017  MRN# [id number] Brian Maldonado 1986/02/21    Brian Maldonado is a 31 y.o. old male seen in consultation for chief complaint of:    Chief Complaint  Patient presents with  . Sleeping Problem    ref by Para Marchuncan: Sleep Consult Patient is tired during the day. He snores as reported by wife and gasp for air.    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  Wife has noted that he is trying to catch his breath while sleeping.  He often wakes up tired in the morning and has morning headaches. Typically goes to bed between 10 PM and midnight.  Also sleep quickly, usually gets out of bed at 7:30 AM.  His Epworth score is very elevated at 17 today.  He apparently has had a sleep study done remotely more than 10 years ago which was apparently negative.Both of his parents have sleep apnea and use cpap.  On weekends he sleeps a bit later, he would sleep until 10 or 11, but he has a 3217 month old which does not let him sleep that late.    PMHX:   Past Medical History:  Diagnosis Date  . Cervicogenic headache 07/01/2015   Left side  . GERD (gastroesophageal reflux disease)   . Headache   . Left shoulder pain   . S/P dilatation of esophageal stricture    Surgical Hx:  Past Surgical History:  Procedure Laterality Date  . ESOPHAGEAL DILATION    . LIPOMA EXCISION     Family Hx:  Family History  Problem Relation Age of Onset  . Sleep apnea Mother   . Migraines Mother   . Diabetes Father   . Sleep apnea Father   . Colon cancer Neg Hx   . Prostate cancer Neg Hx    Social Hx:   Social History  Substance Use  Topics  . Smoking status: Former Smoker    Types: Cigars  . Smokeless tobacco: Never Used  . Alcohol use No   Medication:    Current Outpatient Prescriptions:  .  Biotin 10 MG CAPS, Take by mouth daily., Disp: , Rfl:  .  gabapentin (NEURONTIN) 100 MG capsule, Take 1-3 capsules (100-300 mg total) by mouth 3 (three) times daily as needed (for neck pain)., Disp: 120 capsule, Rfl: 1 .  meloxicam (MOBIC) 7.5 MG tablet, Take 7.5 mg by mouth 2 (two) times daily as needed. With food, Disp: , Rfl:  .  Multiple Vitamin (MULTIVITAMIN) tablet, Take 1 tablet by mouth daily., Disp: , Rfl:  .  omeprazole (PRILOSEC) 20 MG capsule, Take 1 capsule (20 mg total) by mouth daily., Disp: 90 capsule, Rfl: 3 .  traMADol (ULTRAM) 50 MG tablet, take 1 tablet by mouth daily if needed., Disp: 30 tablet, Rfl: 0 .  valACYclovir (VALTREX) 1000 MG tablet, Take 1 tablet (1,000 mg total) by mouth 2 (two) times daily. As needed., Disp: , Rfl:  .  vitamin B-12 (CYANOCOBALAMIN) 1000 MCG tablet, Take 1,000 mcg by mouth daily., Disp: , Rfl:    Allergies:  Sulfa antibiotics  Review of Systems: Gen:  Denies  fever, sweats, chills HEENT: Denies blurred vision, double vision. bleeds, sore throat Cvc:  No dizziness, chest pain. Resp:   Denies cough or sputum production, shortness of breath Gi: Denies swallowing difficulty, stomach pain. Gu:  Denies bladder incontinence, burning urine Ext:   No Joint pain, stiffness. Skin: No skin rash,  hives  Endoc:  No polyuria, polydipsia. Psych: No depression, insomnia. Other:  All other systems were reviewed with the patient and were negative other that what is mentioned in the HPI.   Physical Examination:   VS: BP 130/72 (BP Location: Left Arm, Cuff Size: Large)   Pulse (!) 102   Resp 16   Ht 5\' 10"  (1.778 m)   Wt 212 lb (96.2 kg)   SpO2 99%   BMI 30.42 kg/m   General Appearance: No distress  Neuro:without focal findings,  speech normal,  HEENT: PERRLA, EOM intact.     Pulmonary: normal breath sounds, No wheezing.  CardiovascularNormal S1,S2.  No m/r/g.   Abdomen: Benign, Soft, non-tender. Renal:  No costovertebral tenderness  GU:  No performed at this time. Endoc: No evident thyromegaly, no signs of acromegaly. Skin:   warm, no rashes, no ecchymosis  Extremities: normal, no cyanosis, clubbing.  Other findings:    LABORATORY PANEL:   CBC No results for input(s): WBC, HGB, HCT, PLT in the last 168 hours. ------------------------------------------------------------------------------------------------------------------  Chemistries  No results for input(s): NA, K, CL, CO2, GLUCOSE, BUN, CREATININE, CALCIUM, MG, AST, ALT, ALKPHOS, BILITOT in the last 168 hours.  Invalid input(s): GFRCGP ------------------------------------------------------------------------------------------------------------------  Cardiac Enzymes No results for input(s): TROPONINI in the last 168 hours. ------------------------------------------------------------  RADIOLOGY:  No results found.     Thank  you for the consultation and for allowing [facility] to assist in the care of your patient. Our recommendations are noted above.  Please contact us if we can be of further service.   Wells Guiles, MD.  Board Certified in Internal Medicine, Pulmonary Medicine, Critical Care Medicine, and Sleep Medicine.  [facility] Office Number: [phone]  Santiago Glad, M.D.  Billy Fischer, M.D  04/20/2017

## 2017-04-20 NOTE — Patient Instructions (Addendum)
--  Will send for sleep study, try to sleep on your back for the study.     Sleep Apnea Sleep apnea is disorder that affects a person's sleep. A person with sleep apnea has abnormal pauses in their breathing when they sleep. It is hard for them to get a good sleep. This makes a person tired during the day. It also can lead to other physical problems. There are three types of sleep apnea. One type is when breathing stops for a short time because your airway is blocked (obstructive sleep apnea). Another type is when the brain sometimes fails to give the normal signal to breathe to the muscles that control your breathing (central sleep apnea). The third type is a combination of the other two types. HOME CARE   Take all medicine as told by your doctor.  Avoid alcohol, calming medicines (sedatives), and depressant drugs.  Try to lose weight if you are overweight. Talk to your doctor about a healthy weight goal.  Your doctor may have you use a device that helps to open your airway. It can help you get the air that you need. It is called a positive airway pressure (PAP) device.   MAKE SURE YOU:   Understand these instructions.  Will watch your condition.  Will get help right away if you are not doing well or get worse.  It may take approximately 1 month for you to get used to wearing her CPAP every night.  Be sure to work with your machine to get used to it, be patient, it may take time!

## 2017-04-25 ENCOUNTER — Ambulatory Visit (INDEPENDENT_AMBULATORY_CARE_PROVIDER_SITE_OTHER): Payer: 59 | Admitting: Psychology

## 2017-04-25 DIAGNOSIS — F4323 Adjustment disorder with mixed anxiety and depressed mood: Secondary | ICD-10-CM

## 2017-04-25 DIAGNOSIS — Z630 Problems in relationship with spouse or partner: Secondary | ICD-10-CM | POA: Diagnosis not present

## 2017-04-29 ENCOUNTER — Telehealth: Payer: Self-pay | Admitting: *Deleted

## 2017-04-29 DIAGNOSIS — G4733 Obstructive sleep apnea (adult) (pediatric): Secondary | ICD-10-CM | POA: Diagnosis not present

## 2017-04-29 NOTE — Telephone Encounter (Signed)
Pt informed of sleep study results. Order placed for CPAP auto 5-20 cm H2O. Nothing further needed.

## 2017-05-12 ENCOUNTER — Ambulatory Visit (INDEPENDENT_AMBULATORY_CARE_PROVIDER_SITE_OTHER): Payer: 59 | Admitting: Psychology

## 2017-05-12 DIAGNOSIS — Z630 Problems in relationship with spouse or partner: Secondary | ICD-10-CM | POA: Diagnosis not present

## 2017-05-12 DIAGNOSIS — F4323 Adjustment disorder with mixed anxiety and depressed mood: Secondary | ICD-10-CM

## 2017-05-24 ENCOUNTER — Ambulatory Visit (INDEPENDENT_AMBULATORY_CARE_PROVIDER_SITE_OTHER): Payer: 59 | Admitting: Psychology

## 2017-05-24 DIAGNOSIS — F4323 Adjustment disorder with mixed anxiety and depressed mood: Secondary | ICD-10-CM | POA: Diagnosis not present

## 2017-05-24 DIAGNOSIS — Z630 Problems in relationship with spouse or partner: Secondary | ICD-10-CM | POA: Diagnosis not present

## 2017-06-02 ENCOUNTER — Encounter: Payer: Self-pay | Admitting: Neurology

## 2017-06-02 ENCOUNTER — Ambulatory Visit: Payer: 59 | Admitting: Psychology

## 2017-06-07 ENCOUNTER — Other Ambulatory Visit: Payer: Self-pay | Admitting: Family Medicine

## 2017-06-14 ENCOUNTER — Ambulatory Visit: Payer: 59 | Admitting: Psychology

## 2017-06-14 ENCOUNTER — Encounter: Payer: Self-pay | Admitting: Internal Medicine

## 2017-06-14 DIAGNOSIS — G4719 Other hypersomnia: Secondary | ICD-10-CM

## 2017-06-23 ENCOUNTER — Other Ambulatory Visit: Payer: Self-pay | Admitting: Family Medicine

## 2017-06-23 NOTE — Telephone Encounter (Signed)
Electronic refill request. Tramadol Last office visit:   04/18/17 Last Filled:   30 tablet 0 04/18/2017  Please advise.

## 2017-06-24 ENCOUNTER — Other Ambulatory Visit: Payer: Self-pay | Admitting: Family Medicine

## 2017-06-24 NOTE — Telephone Encounter (Signed)
He has rx for 80 tramadol in the last 16 days.  This is too early to refill and I need input from Dr. Maurice SmallIbazebo prior to any refills.

## 2017-06-24 NOTE — Telephone Encounter (Signed)
He need to contact Dr. Ibazebo's office about his pain meds.  Most recent rx's through his clinic.  Thanks.  

## 2017-06-24 NOTE — Telephone Encounter (Signed)
He need to contact Dr. Marcy SirenIbazebo's office about his pain meds.  Most recent rx's through his clinic.  Thanks.

## 2017-06-24 NOTE — Telephone Encounter (Signed)
Refill on Tramadol and Gabapentin Last OV and refill on 04/18/17.

## 2017-06-24 NOTE — Telephone Encounter (Signed)
Refill request for Gabapentin was also received today in addition to Tramadol yesterday.  Spoke to patient who says at his last visit with Dr. Ibazebo, he was told that he could not continue to prescribe the long-term Tramadol and Gabapentin because the patient has been referred to Neurology.  Patient does have an appointment with Dr. Jaffe, [facility] but the appointment is not until March.  Patient is seeing Dr. Ibazebo again on 07/11/17 and will approach him again about refills until the Neurology appointment but needs this RF now to get him through to 07/11/17.  Patient states he is certainly willing to come in for an appointment with Dr. Duncan if that is the recommendation. 

## 2017-06-24 NOTE — Telephone Encounter (Signed)
Patient advised.

## 2017-06-24 NOTE — Telephone Encounter (Signed)
Copied from CRM [id number]. Topic: Quick Communication - See Telephone Encounter >> Jun 24, 2017 12:47 PM Everardo Pacific[place]oton, Emanie Behan, [place] wrote: CRM for notification. See Telephone encounter for: Patient called because he needs a refill on his Tramadol and Gabapentin. If someone could give him a call back about this at [phone]  06/24/17.

## 2017-06-24 NOTE — Telephone Encounter (Signed)
Refill request for Gabapentin was also received today in addition to Tramadol yesterday.  Spoke to patient who says at his last visit with Dr. Maurice SmallIbazebo, he was told that he could not continue to prescribe the long-term Tramadol and Gabapentin because the patient has been referred to Neurology.  Patient does have an appointment with Dr. Everlena CooperJaffe, [facility] but the appointment is not until March.  Patient is seeing Dr. Maurice SmallIbazebo again on 07/11/17 and will approach him again about refills until the Neurology appointment but needs this RF now to get him through to 07/11/17.  Patient states he is certainly willing to come in for an appointment with Dr. Para Marchuncan if that is the recommendation.

## 2017-06-24 NOTE — Telephone Encounter (Signed)
Gabapentin last refilled # 120 x 1 on 04/18/17; tramadol last refilled # 30 on 04/18/17 (seeing pending refill request for tramadol) and pt last seen 04/18/17.

## 2017-06-26 NOTE — Telephone Encounter (Signed)
Please verify with the pharmacy about any refills of gabapentin other than from our office.   Please let me know.  Thanks.

## 2017-06-27 NOTE — Telephone Encounter (Signed)
Phoned [facility] [address] in [place] who signified that the patient has a Rx for Gabapentin ready for pickup from Dr. Maurice SmallIbazebo.

## 2017-06-27 NOTE — Telephone Encounter (Signed)
Many thanks.  I'll defer.  

## 2017-07-12 ENCOUNTER — Encounter: Payer: Self-pay | Admitting: Internal Medicine

## 2017-07-12 NOTE — Progress Notes (Addendum)
[facility] Consultation      Assessment and Plan:  32 yo male with symtoms and signs of obstructive sleep apnea.   obstructive sleep apnea. --Continue to use CPAP every night.   Chronic rhinitis.  --Start flonase.     Date: 07/12/2017  MRN# [id number] Brian Maldonado Oct 29, 1985    Brian Maldonado is a 32 y.o. old male seen in consultation for chief complaint of:    Chief Complaint  Patient presents with  . Sleep Apnea    doing well but has toddler at home that wakes frequently: not feeling tired during the day.    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  He was found to have OSA and is on CPAP.  He has difficulty breathing through his nose.  He has a cat at home, in bedroom.  He takes no antihistamine, he uses saline at night.   He is using it every night, he has a toddler at home who is occasionally in his bed.  When this happens he moved to a different bed and does not wear the CPAP at that time.  Review of download data 30 days as of 07/12/17.  Usage is 26/30 days.  Average usage on days used is 4 hours 15 minutes.  AutoSet 5-20.  Median pressure is 6, 95th percentile 11, maximum 13.5.  Residual AHI is 1.       Social Hx:   Social History   Tobacco Use  . Smoking status: Former Smoker    Types: Cigars  . Smokeless tobacco: Never Used  Substance Use Topics  . Alcohol use: No  . Drug use: No   Medication:    Current Outpatient Medications:  .  Biotin 10 MG CAPS, Take by mouth daily., Disp: , Rfl:  .  gabapentin (NEURONTIN) 100 MG capsule, Take 1-3 capsules (100-300 mg total) by mouth 3 (three) times daily as needed (for neck pain)., Disp: 120 capsule, Rfl: 1 .  meloxicam (MOBIC) 7.5 MG tablet, Take 7.5 mg by mouth 2 (two) times daily as needed. With food, Disp: , Rfl:  .  Multiple Vitamin (MULTIVITAMIN) tablet, Take 1 tablet by mouth daily., Disp: , Rfl:  .   omeprazole (PRILOSEC) 20 MG capsule, take 1 capsule by mouth once daily, Disp: 90 capsule, Rfl: 1 .  traMADol (ULTRAM) 50 MG tablet, take 1 tablet by mouth daily if needed., Disp: 30 tablet, Rfl: 0 .  valACYclovir (VALTREX) 1000 MG tablet, Take 1 tablet (1,000 mg total) by mouth 2 (two) times daily. As needed., Disp: , Rfl:  .  vitamin B-12 (CYANOCOBALAMIN) 1000 MCG tablet, Take 1,000 mcg by mouth daily., Disp: , Rfl:    Allergies:  Sulfa antibiotics  Review of Systems: Gen:  Denies  fever, sweats, chills HEENT: Denies blurred vision, double vision. bleeds, sore throat Cvc:  No dizziness, chest pain. Resp:   Denies cough or sputum production, shortness of breath Gi: Denies swallowing difficulty, stomach pain. Gu:  Denies bladder incontinence, burning urine Ext:   No Joint pain, stiffness. Skin: No skin rash,  hives  Endoc:  No polyuria, polydipsia. Psych: No depression, insomnia. Other:  All other systems were reviewed with the patient and were negative other that what is mentioned in the HPI.   Physical Examination:   VS: BP 118/80 (BP Location: Left Arm, Cuff Size: Normal)   Pulse (!) 110  Ht 5\' 10"  (1.778 m)   Wt 225 lb (102.1 kg)   SpO2 100%   BMI 32.28 kg/m   General Appearance: No distress  Neuro:without focal findings,  speech normal,  HEENT: PERRLA, EOM intact.   Pulmonary: normal breath sounds, No wheezing.  CardiovascularNormal S1,S2.  No m/r/g.   Abdomen: Benign, Soft, non-tender. Renal:  No costovertebral tenderness  GU:  No performed at this time. Endoc: No evident thyromegaly, no signs of acromegaly. Skin:   warm, no rashes, no ecchymosis  Extremities: normal, no cyanosis, clubbing.  Other findings:    LABORATORY PANEL:   CBC No results for input(s): WBC, HGB, HCT, PLT in the last 168 hours. ------------------------------------------------------------------------------------------------------------------  Chemistries  No results for input(s): NA, K,  CL, CO2, GLUCOSE, BUN, CREATININE, CALCIUM, MG, AST, ALT, ALKPHOS, BILITOT in the last 168 hours.  Invalid input(s): GFRCGP ------------------------------------------------------------------------------------------------------------------  Cardiac Enzymes No results for input(s): TROPONINI in the last 168 hours. ------------------------------------------------------------  RADIOLOGY:  No results found.     Thank  you for the consultation and for allowing [facility], [facility] to assist in the care of your patient. Our recommendations are noted above.  Please contact us if we can be of further service.   Wells Guileseep Lajuan Godbee, MD.  Board Certified in Internal Medicine, Pulmonary Medicine, Critical Care Medicine, and Sleep Medicine.  [facility] Office Number: [phone]  Santiago Gladavid Kasa, M.D.  Billy Fischeravid Simonds, M.D  07/12/2017

## 2017-07-13 ENCOUNTER — Ambulatory Visit: Payer: 59 | Admitting: Internal Medicine

## 2017-07-13 ENCOUNTER — Encounter: Payer: Self-pay | Admitting: Internal Medicine

## 2017-07-13 VITALS — BP 118/80 | HR 110 | Ht 70.0 in | Wt 225.0 lb

## 2017-07-13 DIAGNOSIS — G4733 Obstructive sleep apnea (adult) (pediatric): Secondary | ICD-10-CM | POA: Diagnosis not present

## 2017-07-13 NOTE — Patient Instructions (Addendum)
--  Continue to try to use CPAP every night.   --Start flonase 2 sprays in each nostril, if you develop a nose bleed change to 1 spray in each nostil.

## 2017-08-01 ENCOUNTER — Telehealth: Payer: Self-pay | Admitting: Family Medicine

## 2017-08-01 NOTE — Telephone Encounter (Signed)
Copied from CRM [phone]. Topic: General - Other >> Aug 01, 2017  9:57 AM Stephannie LiSimmons, Janett L, NT wrote: Reason for CRM: Patient wants the pcp to know he is using the tramadol only when pain is severe he is using the tylenol  more as instructed

## 2017-08-01 NOTE — Telephone Encounter (Signed)
Copied from CRM [phone]. Topic: Quick Communication - Rx Refill/Question >> Aug 01, 2017  9:56 AM Avie ArenasSimmons, Vraj Denardo L, NT wrote: Medication: gabapentin 100 mg and also tramadol  50 mg  Has the patient contacted their pharmacy? no (Agent: If no, request that the patient contact the pharmacy for the refill.) Preferred Pharmacy (with phone number or street name) [facility] [address] [facility] [place]  [phone] Agent: Please be advised that RX refills may take up to 3 business days. We ask that you follow-up with your pharmacy.

## 2017-08-01 NOTE — Telephone Encounter (Signed)
Rx refill request- for provider review: Gabapentin 100 mg Ultram 50 mg  LOV: 04/18/2017  Last filled 06/24/17  Pharmacy: verified

## 2017-08-02 MED ORDER — GABAPENTIN 300 MG PO CAPS: 300.0000 mg | ORAL_CAPSULE | Freq: Four times a day (QID) | ORAL | 0 refills | Status: DC

## 2017-08-02 NOTE — Telephone Encounter (Signed)
Patient advised. Appointment scheduled.  

## 2017-08-02 NOTE — Telephone Encounter (Signed)
Gabapentin sent.  Per notes he was up to 300mg  qid on gabapentin.  Keep the appointment with neuro to see if they think he should continue that med.  Notes from Dr. Maurice SmallIbazebo list him taking tramadol 1-2 times per week, tapering down.  He had rx filled for tramadol (#40) on 07/07/17, so he should have plenty left.

## 2017-08-02 NOTE — Telephone Encounter (Signed)
Spoke with Dr. Marcy SirenIbazebo's office who states that he has no upcoming appointment scheduled.  Last OV states that he is to call as needed and that it is ok for Dr. Para Marchuncan to prescribe the Gabapentin until he can get in to see a Neurologist.  No mention was made of Tramadol.

## 2017-08-02 NOTE — Telephone Encounter (Addendum)
Tramadol had been getting filled by Dr. Maurice SmallIbazebo. Please check with his clinic about his f/u and plan for pain meds.  I hadn't filled either med recently. Thanks.

## 2017-08-02 NOTE — Addendum Note (Signed)
Addended by: Joaquim NamUNCAN, Deserie Dirks S on: 08/02/2017 05:14 PM   Modules accepted: Orders

## 2017-08-04 ENCOUNTER — Encounter: Payer: Self-pay | Admitting: Family Medicine

## 2017-08-04 ENCOUNTER — Ambulatory Visit (INDEPENDENT_AMBULATORY_CARE_PROVIDER_SITE_OTHER): Payer: 59 | Admitting: Family Medicine

## 2017-08-04 DIAGNOSIS — G4486 Cervicogenic headache: Secondary | ICD-10-CM

## 2017-08-04 DIAGNOSIS — R51 Headache: Secondary | ICD-10-CM | POA: Diagnosis not present

## 2017-08-04 MED ORDER — TRAMADOL HCL 50 MG PO TABS: 50.0000 mg | ORAL_TABLET | Freq: Two times a day (BID) | ORAL | 0 refills | Status: DC | PRN

## 2017-08-04 NOTE — Progress Notes (Signed)
Prev MR with  1. Shallow right paracentral disc protrusion at C6-7 without stenosis. 2. Small right foraminal disc osteophyte complex at C3-4 with resultant mild right C4 foraminal stenosis. 3. Mild diffuse disc bulge at C5-6 without stenosis.  Ortho visit d/w pt.  Had been getting tramadol per ortho.    Taking gabapentin 3 tabs per day. He gets a little drowsy on med.  Some days are better than others.  We talked about dosing options with gabapentin.  He may be able to tolerate one in the morning, 1 midday, and 2 at night.  His shoulder pain is some better.  He is s/p arthroscopy.  He is still having pain in the occipital area.  The pain was a little better for about 1 day after prev injection, prior to inc in gabapentin.  His L shoulder ROM is normal now.    The plan is f/u with neuro for consideration of injection.  He continues to have lateral occipital pain.  3-4/10 pain with gabapentin, 5-6/10 off med.    He is caring for his nearly 102 y/o young daughter and that is an ongoing strain for him- lifting her, etc.   He isn't out of tramadol yet.  He is trying use tylenol as needed first for pain.    He is getting pain in the B hand with some numbness.  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  Bracing helps.    Meds, vitals, and allergies reviewed.   ROS: Per HPI unless specifically indicated in ROS section   GEN: nad, alert and oriented HEENT: mucous membranes moist NECK: supple w/o LA, occiput tender bilaterally, L>R.  No midline neck pain. CV: rrr.  no murmur PULM: ctab, no inc wob ABD: soft, +bs EXT: no edema SKIN: no acute rash Right wrist Tinel positive.  Right Finkelstein positive on the thumb.  Sensation grossly intact in the hands bilaterally.  Grip within normal limits otherwise.  No tendon deficit otherwise. Normal radial pulses bilaterally.

## 2017-08-04 NOTE — Patient Instructions (Addendum)
Try getting up to 4 gabapentin a day, 1-1-2.   Use a hard wrist/thumb spica brace whenever you can.  If you need more than 10 tramadol in a week, then we need to change your gabapentin to another med.  Likely CTS/tendonitis.  Take care.  Glad to see you.  Update me as needed.

## 2017-08-07 NOTE — Assessment & Plan Note (Addendum)
He'll try getting up to 4 gabapentin a day, 1-1-2.   If needing more than 10 tramadol in a week, then we need to change his gabapentin to another med.  Tramadol prescription written, printed and given to patient.  Controlled substance database reviewed. He has neurology consult pending.  I appreciate neurology seeing the patient.  Also likely with CTS/tendonitis as a separate issue. Reasonable to use a hard wrist/thumb spica brace whenever possible.   Anatomy of occipital neuralgia and carpal tunnel and de Quervain's tendinitis discussed with patient.  All questions answered. >25 minutes spent in face to face time with patient, >50% spent in counselling or coordination of care.

## 2017-08-16 ENCOUNTER — Telehealth: Payer: Self-pay

## 2017-08-16 NOTE — Telephone Encounter (Signed)
Please advise 

## 2017-08-16 NOTE — Telephone Encounter (Signed)
Patient called office wanting to know if Dr. Sherrie MustacheFisher would take him back as a patient? Patient reports that it has been more than 5 yrs since he has seen Dr. Sherrie MustacheFisher and he states that he just moved back to area. Patient would like a callback with nurse if possible, patient noted that his father also see Dr. Sherrie MustacheFisher ( Samella Parrhomas Provencher) and states that Dr. Sherrie MustacheFisher knows there family very well. KW

## 2017-08-16 NOTE — Telephone Encounter (Signed)
Not taking new patients at this time.  (btw, is active patient of Dr. Para Marchuncan  At [facility] since last year)

## 2017-08-17 NOTE — Telephone Encounter (Signed)
Pt called back about re-establishing with Dr. Sherrie MustacheFisher. Pt was advised as below. Thanks TNP

## 2017-08-22 ENCOUNTER — Telehealth: Payer: Self-pay | Admitting: Family Medicine

## 2017-08-22 NOTE — Telephone Encounter (Signed)
Copied from CRM [phone]. Topic: Quick Communication - Rx Refill/Question >> Aug 22, 2017  2:43 PM Cipriano BunkerLambe, Annette S wrote: Medication:  traMADol (ULTRAM) 50 MG tablet  Has the patient contacted their pharmacy? No Pt switched pharmacy - his closed.  (Agent: If no, request that the patient contact the pharmacy for the refill.)   Preferred Pharmacy (with phone number or street name): [phone] [place] [address] [facility] [address] [facility] [facility] [address] [place] [id number] Phone: [phone] Fax: [phone]     Agent: Please be advised that RX refills may take up to 3 business days. We ask that you follow-up with your pharmacy.

## 2017-08-23 NOTE — Telephone Encounter (Signed)
Last refill 08/04/17 #30

## 2017-08-23 NOTE — Telephone Encounter (Signed)
Last OV: 08/04/17 PCP: Dr. Para Marchuncan [facility] [place]; [address]

## 2017-08-24 MED ORDER — TRAMADOL HCL 50 MG PO TABS: 50.0000 mg | ORAL_TABLET | Freq: Two times a day (BID) | ORAL | Status: DC | PRN

## 2017-08-24 NOTE — Telephone Encounter (Signed)
Denied.   Too early to refill.  Had d/w pt at the last OV.  See next line, pasted into this note.  (If you need more than 10 tramadol in a week, then we need to change your gabapentin to another med.)  He was going to get set up at another clinic in the meantime Charlaine Dalton(Donald E FisherMD).  I'm not going to fill his tramadol early.   If he is still going to come to this clinic then he needs OV to discuss changing his gabapentin.  O/w I wish him the best and I'll defer to his new MD at another clinic.

## 2017-08-24 NOTE — Telephone Encounter (Signed)
We can't refill the tramadol before 09/03/17, at the earliest.  Thanks.

## 2017-08-24 NOTE — Telephone Encounter (Signed)
Patient notified as instructed by telephone and verbalized understanding. Patient stated that he is not changing from Dr. Para Marchuncan. Patient stated that his insurance changed and that Dr. Sherrie MustacheFisher is a tier one which would only cost him a co-pay of $20 and Dr. Para Marchuncan is a tier 3 which the cost of the office visit goes towards his deductible, so each visit here would be over $200. Patient stated that he is happy with Dr. Para Marchuncan and would like to stay with him. . Patient stated that Dr. Sherrie MustacheFisher is not taking any new patients and therefore he is staying with Dr. Para Marchuncan. Patient stated that he called about the refill on Tramadol because he is changing pharmacies. Patient stated that he is sorry that he caused any confusion, but he does have some Tramadol left. Patient stated that he is not wanting to change his Gabapentin and is not going to take more tramadol than discussed previously with Dr. Para Marchuncan. Patient wants to know when he should request the refill on the Tramadol and request a call back regarding this?

## 2017-08-25 ENCOUNTER — Other Ambulatory Visit: Payer: Self-pay | Admitting: Family Medicine

## 2017-08-25 NOTE — Telephone Encounter (Signed)
Patient called and was informed that the Tramadol prescription could not be refilled before 09/03/17 and he was okay with that.  He said the prescription can be sent to his pharmacy with the stipulation not to refill until 09/03/17.

## 2017-08-28 MED ORDER — TRAMADOL HCL 50 MG PO TABS: 50.0000 mg | ORAL_TABLET | Freq: Two times a day (BID) | ORAL | 0 refills | Status: DC | PRN

## 2017-08-28 NOTE — Telephone Encounter (Signed)
Sent to be filled on/after 09/03/17.  Thanks.

## 2017-09-09 ENCOUNTER — Other Ambulatory Visit: Payer: Self-pay | Admitting: Family Medicine

## 2017-09-09 NOTE — Telephone Encounter (Signed)
Last filled 08/02/17... Please advise 

## 2017-09-11 MED ORDER — GABAPENTIN 300 MG PO CAPS: 300.0000 mg | ORAL_CAPSULE | Freq: Four times a day (QID) | ORAL | 0 refills | Status: DC

## 2017-09-11 NOTE — Telephone Encounter (Signed)
Sent. Thanks.   

## 2017-09-12 ENCOUNTER — Encounter: Payer: Self-pay | Admitting: Family Medicine

## 2017-09-19 ENCOUNTER — Ambulatory Visit: Payer: 59 | Admitting: Neurology

## 2017-09-19 ENCOUNTER — Encounter: Payer: Self-pay | Admitting: Neurology

## 2017-09-19 VITALS — BP 96/62 | HR 98 | Resp 16 | Ht 69.0 in | Wt 228.2 lb

## 2017-09-19 DIAGNOSIS — M542 Cervicalgia: Secondary | ICD-10-CM | POA: Diagnosis not present

## 2017-09-19 MED ORDER — GABAPENTIN 300 MG PO CAPS: 300.0000 mg | ORAL_CAPSULE | Freq: Four times a day (QID) | ORAL | 4 refills | Status: DC

## 2017-09-19 MED ORDER — TRAMADOL HCL 50 MG PO TABS: 50.0000 mg | ORAL_TABLET | Freq: Two times a day (BID) | ORAL | 1 refills | Status: DC | PRN

## 2017-09-19 NOTE — Patient Instructions (Signed)
1.  Gabapentin and tramadol refilled 2.  Consider turmeric 500mg  once to twice daily for anti-inflammatory properties. 3.  Contact Dr. Maurice SmallIbazebo regarding ablation.
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
## 2017-10-17 ENCOUNTER — Telehealth: Payer: Self-pay | Admitting: *Deleted

## 2017-10-17 DIAGNOSIS — N50819 Testicular pain, unspecified: Secondary | ICD-10-CM
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
## 2017-10-18 ENCOUNTER — Telehealth: Payer: Self-pay

## 2017-10-18 NOTE — Telephone Encounter (Signed)
Appt made and patient aware.  

## 2017-10-18 NOTE — Telephone Encounter (Signed)
Referral done.  Thanks.  If severe pain or change in sx, then needs eval sooner.

## 2017-10-18 NOTE — Telephone Encounter (Signed)
Patient advised.

## 2017-10-18 NOTE — Telephone Encounter (Signed)
Copied from CRM [phone]#[id number]. Topic: Referral - Question >> Oct 18, 2017 11:56 AM Eston Mouldavis, Cheri B wrote: Reason for CRM:PT wants to know where he is being referred to so he can call them-  Order Ambulatory referral to Urology (Order # [id number]) on 10/18/2017

## 2017-10-20 ENCOUNTER — Encounter: Payer: Self-pay | Admitting: Neurology

## 2017-10-20 ENCOUNTER — Other Ambulatory Visit: Payer: Self-pay | Admitting: Neurology
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
## 2017-11-05 ENCOUNTER — Other Ambulatory Visit: Payer: Self-pay | Admitting: Family Medicine

## 2017-11-14 ENCOUNTER — Other Ambulatory Visit: Payer: Self-pay | Admitting: Neurology

## 2017-12-18 ENCOUNTER — Other Ambulatory Visit: Payer: Self-pay | Admitting: Neurology

## 2017-12-19 NOTE — Telephone Encounter (Signed)
Appears that he filled this 5/20 and 6/3?  If so, he filled too early and cannot fill.  Can you confirm with pharmacy?

## 2017-12-19 NOTE — Telephone Encounter (Signed)
Called Pharmacy, spoke with Tacey RuizLeah. Pt filled 5/20 and again on 6/3. I advised Tacey RuizLeah it was too soon, and would not be refilled

## 2017-12-19 NOTE — Telephone Encounter (Signed)
Celene SquibbAdam, fyi

## 2017-12-20 ENCOUNTER — Telehealth: Payer: Self-pay | Admitting: Neurology

## 2017-12-20 ENCOUNTER — Other Ambulatory Visit: Payer: Self-pay | Admitting: Neurology

## 2017-12-20 MED ORDER — TRAMADOL HCL 50 MG PO TABS: 50.0000 mg | ORAL_TABLET | Freq: Two times a day (BID) | ORAL | 0 refills | Status: DC | PRN

## 2017-12-20 NOTE — Telephone Encounter (Signed)
Called and spoke with Pt, advised him I called in the refill, spoke with Alvino ChapelEllen

## 2017-12-20 NOTE — Telephone Encounter (Signed)
Patient called and was needing to check to see if his Prescription had been called in for him to pick up today? Please Call. Thanks

## 2017-12-22 NOTE — Telephone Encounter (Signed)
Dr Everlena CooperJaffe asked that I call [facility] to cx Tramodol Rx he approved this morning. Dr Everlena CooperJaffe meant to deny. [facility], spoke with Caitlynn. She cx Rx. I advised her Pt may only refill after 30 days.

## 2017-12-22 NOTE — Telephone Encounter (Signed)
Called Pharmacy, spoke with Caitlyn. Dr Everlena CooperJaffe mistakenly confirmed Tramadol with 5 refills. I advsd Caitlyn to cx Rx. Pt may only have 30 pills per 30 days.

## 2017-12-23 ENCOUNTER — Telehealth: Payer: Self-pay | Admitting: *Deleted

## 2017-12-23 NOTE — Telephone Encounter (Signed)
Called and spoke with Pt, advised him he will need to only fill the tramadol every 30 days, #30 will need to last him 30 days. Pt verbalized understanding.

## 2017-12-23 NOTE — Telephone Encounter (Signed)
Patient called wanting to know if he can get extra refills, patient said he uses a app and it looks weird and he does not know if the refills are approved

## 2018-01-08 ENCOUNTER — Other Ambulatory Visit: Payer: Self-pay | Admitting: Neurology

## 2018-01-09 ENCOUNTER — Telehealth: Payer: Self-pay | Admitting: Neurology

## 2018-01-10 NOTE — Telephone Encounter (Signed)
Brian FallenKenneth called back inquiring about denial of medication renewal, he stated he had sent the request a little early because he wanted to allow a couple of days, he will need refill on Saturday. He stated he would resubmit Thursday or Friday of this week.

## 2018-01-10 NOTE — Telephone Encounter (Signed)
Sent Pt a my chart message advising him he may refill Tramadol 01/19/18

## 2018-01-17 ENCOUNTER — Other Ambulatory Visit: Payer: Self-pay | Admitting: Neurology

## 2018-01-17 MED ORDER — TRAMADOL HCL 50 MG PO TABS: 50.0000 mg | ORAL_TABLET | Freq: Two times a day (BID) | ORAL | 0 refills | Status: DC | PRN

## 2018-01-17 NOTE — Telephone Encounter (Signed)
[facility], spoke with D.R. Horton, IncConner. Advised him not to refill until 01/19/18

## 2018-02-03 ENCOUNTER — Other Ambulatory Visit: Payer: Self-pay | Admitting: Family Medicine

## 2018-02-13 ENCOUNTER — Other Ambulatory Visit: Payer: Self-pay

## 2018-02-15 ENCOUNTER — Other Ambulatory Visit: Payer: Self-pay

## 2018-02-15 ENCOUNTER — Other Ambulatory Visit: Payer: Self-pay | Admitting: Neurology

## 2018-02-15 MED ORDER — TRAMADOL HCL 50 MG PO TABS: 50.0000 mg | ORAL_TABLET | Freq: Two times a day (BID) | ORAL | 1 refills | Status: DC | PRN

## 2018-02-15 MED ORDER — GABAPENTIN 300 MG PO CAPS: 300.0000 mg | ORAL_CAPSULE | Freq: Four times a day (QID) | ORAL | 1 refills | Status: DC

## 2018-02-20 ENCOUNTER — Ambulatory Visit: Payer: Self-pay | Admitting: Neurology

## 2018-03-31 ENCOUNTER — Ambulatory Visit: Payer: Self-pay | Admitting: Neurology

## 2018-07-03 ENCOUNTER — Other Ambulatory Visit: Payer: Self-pay | Admitting: Family Medicine

## 2018-07-06 DIAGNOSIS — H90A12 Conductive hearing loss, unilateral, left ear with restricted hearing on the contralateral side: Secondary | ICD-10-CM | POA: Diagnosis not present

## 2018-07-06 DIAGNOSIS — H6983 Other specified disorders of Eustachian tube, bilateral: Secondary | ICD-10-CM | POA: Diagnosis not present

## 2018-07-06 DIAGNOSIS — J301 Allergic rhinitis due to pollen: Secondary | ICD-10-CM | POA: Diagnosis not present

## 2018-07-10 ENCOUNTER — Other Ambulatory Visit: Payer: Self-pay

## 2018-07-10 ENCOUNTER — Emergency Department: Payer: BLUE CROSS/BLUE SHIELD

## 2018-07-10 ENCOUNTER — Emergency Department
Admission: EM | Admit: 2018-07-10 | Discharge: 2018-07-11 | Disposition: A | Discharge status: Home / Self Care | Payer: BLUE CROSS/BLUE SHIELD | Attending: Emergency Medicine | Admitting: Emergency Medicine

## 2018-07-10 DIAGNOSIS — Z79899 Other long term (current) drug therapy: Secondary | ICD-10-CM | POA: Insufficient documentation

## 2018-07-10 DIAGNOSIS — R42 Dizziness and giddiness: Secondary | ICD-10-CM | POA: Diagnosis not present

## 2018-07-10 DIAGNOSIS — Z87891 Personal history of nicotine dependence: Secondary | ICD-10-CM | POA: Diagnosis not present

## 2018-07-10 DIAGNOSIS — G43009 Migraine without aura, not intractable, without status migrainosus: Secondary | ICD-10-CM | POA: Diagnosis not present

## 2018-07-10 DIAGNOSIS — H9209 Otalgia, unspecified ear: Secondary | ICD-10-CM | POA: Diagnosis not present

## 2018-07-10 DIAGNOSIS — M62838 Other muscle spasm: Secondary | ICD-10-CM | POA: Diagnosis not present

## 2018-07-10 LAB — BASIC METABOLIC PANEL
Anion gap: 9 (ref 5–15)
BUN: 24 mg/dL — ABNORMAL HIGH (ref 6–20)
CO2: 24 mmol/L (ref 22–32)
Calcium: 9.4 mg/dL (ref 8.9–10.3)
Chloride: 108 mmol/L (ref 98–111)
Creatinine, Ser: 1.17 mg/dL (ref 0.61–1.24)
GFR calc Af Amer: >60 mL/min (ref >60)
GFR calc non Af Amer: >60 mL/min (ref >60)
Glucose, Bld: 116 mg/dL — ABNORMAL HIGH (ref 70–99)
Potassium: 3.4 mmol/L — ABNORMAL LOW (ref 3.5–5.1)
Sodium: 141 mmol/L (ref 135–145)

## 2018-07-10 LAB — CBC
HCT: 42.1 % (ref 39.0–52.0)
Hemoglobin: 14.6 g/dL (ref 13.0–17.0)
MCH: 31.4 pg (ref 26.0–34.0)
MCHC: 34.7 g/dL (ref 30.0–36.0)
MCV: 90.5 fL (ref 80.0–100.0)
Platelets: 218 10*3/uL (ref 150–400)
RBC: 4.65 MIL/uL (ref 4.22–5.81)
RDW: 12.3 % (ref 11.5–15.5)
WBC: 10.5 10*3/uL (ref 4.0–10.5)
nRBC: 0 % (ref 0.0–0.2)

## 2018-07-10 NOTE — ED Notes (Signed)
Pt reports pain behind left ear.  Sx began yesterday.  Pt reports going to urgent care today and was dx with migraine h/a  Pt was given toradol.  Pt slept and woke up again with a h/a  No vomiting.  No nausea.  Pt alert.  Speech clear.  Family with pt.

## 2018-07-10 NOTE — ED Triage Notes (Signed)
Pt reports to the er for headache and dizziness since last night. Pt has a hx of a pinched nerve in his neck which he takes gabapentin but states this feels different. Pt reports good PO intake. Pt reoports dizziness when he stands and when he eats. Pt seen at urgent care today and then got worse tonight. Pt reports left ear pain. Pt saw ENT Thursday and RX prednisone and nasal spray. Saw urgent care today and dx with migraine and given shots of toradol, flexeril and PO IBU.

## 2018-07-11 MED ORDER — MECLIZINE HCL 25 MG PO TABS
25.0000 mg | ORAL_TABLET | Freq: Once | ORAL | Status: AC
  Administered 2018-07-11: 25 mg via ORAL
  Filled 2018-07-11: qty 1

## 2018-07-11 MED ORDER — MECLIZINE HCL 25 MG PO TABS: 25.0000 mg | ORAL_TABLET | Freq: Three times a day (TID) | ORAL | 0 refills | Status: DC | PRN

## 2018-07-11 NOTE — Discharge Instructions (Signed)
Please call tomorrow to schedule an appointment with either your primary care provider or your neurologist.  Return to the emergency department for symptoms of change or worsen if you are unable to schedule an appointment.

## 2018-07-11 NOTE — ED Provider Notes (Signed)
[facility] Emergency Department Provider Note  ____________________________________________   First MD Initiated Contact with Patient 07/10/18 2227     (approximate)  I have reviewed the triage vital signs and the nursing notes.   HISTORY  Chief Complaint Dizziness   HPI Brian Maldonado is a 33 y.o. male presents to the emergency department for treatment and evaluation of headache and dizziness that worsened last night.  He was evaluated Thursday for dizziness by the ear nose and throat doctor and was prescribed prednisone and nasal spray and told to take Claritin as the ENT doctor felt like it was related to allergies.  He was evaluated at urgent care earlier today and was diagnosed with a migraine headache.  He was given prescriptions for Toradol, Flexeril, and ibuprofen.  Patient states that the dizziness him feel worse tonight and decided to come to the emergency department.  He has had no nausea or vomiting.  He has had no blurred vision, confusion, or weakness.    Past Medical History:  Diagnosis Date  . Cervicogenic headache 07/01/2015   Left side  . GERD (gastroesophageal reflux disease)   . Headache   . Left shoulder pain   . S/P dilatation of esophageal stricture   . Sleep apnea 2018    Patient Active Problem List   Diagnosis Date Noted  . Snoring 04/19/2017  . Neck pain on left side 03/29/2017  . Encounter for general adult medical examination with abnormal findings 01/17/2017  . Cold sore 01/17/2017  . Left shoulder pain 06/02/2016  . S/P dilatation of esophageal stricture 06/02/2016  . Carpal tunnel syndrome 06/02/2016  . Advance care planning 06/01/2016  . Cervicogenic headache 07/01/2015    Past Surgical History:  Procedure Laterality Date  . ESOPHAGEAL DILATION    . LIPOMA EXCISION    . SHOULDER ARTHROSCOPY      Prior to Admission medications   Medication Sig Start Date End Date Taking? Authorizing Provider  Biotin 10 MG  CAPS Take by mouth daily.    [provider]  gabapentin (NEURONTIN) 300 MG capsule Take 1 capsule (300 mg total) by mouth 4 (four) times daily. 09/19/17   Drema DallasJaffe, Adam R, DO  gabapentin (NEURONTIN) 300 MG capsule Take 1 capsule (300 mg total) by mouth 4 (four) times daily. 02/15/18   Drema DallasJaffe, Adam R, DO  meclizine (ANTIVERT) 25 MG tablet Take 1 tablet (25 mg total) by mouth 3 (three) times daily as needed for dizziness. 07/11/18   Jabari Swoveland, Rulon Eisenmengerari B, FNP  Multiple Vitamin (MULTIVITAMIN) tablet Take 1 tablet by mouth daily.    [provider]  omeprazole (PRILOSEC) 20 MG capsule TAKE 1 TABLET BY MOUTH ONCE DAILY 07/03/18   Joaquim Namuncan, Graham S, MD  traMADol (ULTRAM) 50 MG tablet TAKE 1 TABLET BY MOUTH EVERY 12 HOURS AS NEEDED 12/22/17   Drema DallasJaffe, Adam R, DO  traMADol (ULTRAM) 50 MG tablet Take 1 tablet (50 mg total) by mouth every 12 (twelve) hours as needed. 01/17/18   Drema DallasJaffe, Adam R, DO  traMADol (ULTRAM) 50 MG tablet Take 1 tablet (50 mg total) by mouth every 12 (twelve) hours as needed. 02/15/18   Drema DallasJaffe, Adam R, DO  vitamin B-12 (CYANOCOBALAMIN) 1000 MCG tablet Take 1,000 mcg by mouth daily.    [provider]    Allergies Sulfa antibiotics  Family History  Problem Relation Age of Onset  . Sleep apnea Mother   . Migraines Mother   . Diabetes Father   . Sleep  apnea Father   . Colon cancer Neg Hx   . Prostate cancer Neg Hx     Social History Social History   Tobacco Use  . Smoking status: Former Smoker    Types: Cigars  . Smokeless tobacco: Never Used  Substance Use Topics  . Alcohol use: No  . Drug use: No    Review of Systems  Constitutional: No fever/chills Eyes: No visual changes. ENT: No sore throat. Cardiovascular: Denies chest pain. Respiratory: Denies shortness of breath. Gastrointestinal: No abdominal pain.  No nausea, no vomiting.  No diarrhea.  No constipation. Genitourinary: Negative for dysuria. Musculoskeletal: Negative for back pain. Skin:  Negative for rash. Neurological: Negative for headaches, focal weakness or numbness. ____________________________________________   PHYSICAL EXAM:  VITAL SIGNS: ED Triage Vitals  Enc Vitals Group     BP 07/10/18 2024 131/78     Pulse Rate 07/10/18 2024 85     Resp 07/10/18 2024 18     Temp 07/10/18 2024 98.3 F (36.8 C)     Temp Source 07/10/18 2024 Oral     SpO2 07/10/18 2024 100 %     Weight 07/10/18 2024 230 lb (104.3 kg)     Height 07/10/18 2024 5\' 9"  (1.753 m)     Head Circumference --      Peak Flow --      Pain Score 07/10/18 2033 4     Pain Loc --      Pain Edu? --      Excl. in GC? --     Constitutional: Alert and oriented. Well appearing and in no acute distress. Eyes: Conjunctivae are normal. PERRL. EOMI. Head: Atraumatic. Nose: No congestion/rhinnorhea. Mouth/Throat: Mucous membranes are moist.  Oropharynx non-erythematous. Neck: No stridor.   Cardiovascular: Normal rate, regular rhythm. Grossly normal heart sounds.  Good peripheral circulation. Respiratory: Normal respiratory effort.  No retractions. Lungs CTAB. Gastrointestinal: Soft and nontender. No distention. No abdominal bruits. No CVA tenderness. Musculoskeletal: No lower extremity tenderness nor edema.  No joint effusions. Neurologic:  Normal speech and language. No gross focal neurologic deficits are appreciated. No gait instability. Skin:  Skin is warm, dry and intact. No rash noted. Psychiatric: Mood and affect are normal. Speech and behavior are normal.  ____________________________________________   LABS (all labs ordered are listed, but only abnormal results are displayed)  Labs Reviewed  BASIC METABOLIC PANEL - Abnormal; Notable for the following components:      Result Value   Potassium 3.4 (*)    Glucose, Bld 116 (*)    BUN 24 (*)    All other components within normal limits  CBC  URINALYSIS, COMPLETE (UACMP) WITH MICROSCOPIC  CBG MONITORING, ED    ____________________________________________  EKG  ED ECG REPORT I, Costas Sena, FNP-BC personally viewed and interpreted this ECG.   Date: 07/10/2018  EKG Time: 8:22 PM  Rate: 78  Rhythm: normal EKG, normal sinus rhythm  Axis: normal  Intervals:none  ST&T Change: None  ____________________________________________  RADIOLOGY  ED MD interpretation: CT head negative for acute findings per radiology.  Official radiology report(s): Ct Head Wo Contrast  Result Date: 07/10/2018 CLINICAL DATA:  Pain behind left ear. EXAM: CT HEAD WITHOUT CONTRAST TECHNIQUE: Contiguous axial images were obtained from the base of the skull through the vertex without intravenous contrast. COMPARISON:  None. FINDINGS: Brain: No evidence of acute infarction, hemorrhage, hydrocephalus, extra-axial collection or mass lesion/mass effect. Vascular: No hyperdense vessel or unexpected calcification. Skull: Normal. Negative for fracture or focal lesion.  Sinuses/Orbits: No acute finding. Other: None. IMPRESSION: No acute intracranial abnormality. Electronically Signed   By: Ted Mcalpine M.D.   On: 07/10/2018 23:18    ____________________________________________   PROCEDURES  Procedure(s) performed: None  Procedures  Critical Care performed: No  ____________________________________________   INITIAL IMPRESSION / ASSESSMENT AND PLAN / ED COURSE  As part of my medical decision making, I reviewed the following data within the electronic MEDICAL RECORD NUMBER Notes from prior ED visits   33 year old male presenting to the emergency department for treatment and evaluation of dizziness.  Patient states that it seems to be a spinning sensation that he has not experienced before.  He was taking gabapentin for a "pinched nerve" in his neck, but stopped taking that in November.  Since then he has had no real issues with his neck, but did not know if the dizziness could be related.  Labs, EKG, and head CT are  reassuring.  He will be prescribed meclizine and will follow-up with primary care this week.  He was also encouraged to see his neurologist prior to restarting his gabapentin.  He was advised to return to the emergency department for symptoms of change or worsen if unable to schedule appointment. __________________________________________   FINAL CLINICAL IMPRESSION(S) / ED DIAGNOSES  Final diagnoses:  Dizziness     ED Discharge Orders         Ordered    meclizine (ANTIVERT) 25 MG tablet  3 times daily PRN     07/11/18 0003           Note:  This document was prepared using Dragon voice recognition software and may include unintentional dictation errors.    Chinita Pester, FNP 07/11/18 0259    Darci Current, MD 07/11/18 [id number]

## 2018-08-01 ENCOUNTER — Ambulatory Visit: Payer: Self-pay | Admitting: Neurology

## 2018-09-11 NOTE — Progress Notes (Deleted)
[facility] Consultation      Assessment and Plan:  33 yo male with symtoms and signs of obstructive sleep apnea.   OSA.  --Continue to use CPAP every night.   Chronic rhinitis.  --Start flonase.     Date: 09/11/2018  MRN# [id number] Brian Maldonado September 06, 1985    Brian Maldonado is a 33 y.o. old male seen in consultation for chief complaint of:    No chief complaint on file.   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  He was found to have OSA and is on CPAP.  He has difficulty breathing through his nose.  He has a cat at home, in bedroom.  He takes no antihistamine, he uses saline at night.   He is using it every night, he has a toddler at home who is occasionally in his bed.  When this happens he moved to a different bed and does not wear the CPAP at that time.  Review of download data 30 days as of 07/12/17.  Usage is 26/30 days.  Average usage on days used is 4 hours 15 minutes.  AutoSet 5-20.  Median pressure is 6, 95th percentile 11, maximum 13.5.  Residual AHI is 1.       Social Hx:   Social History   Tobacco Use  . Smoking status: Former Smoker    Types: Cigars  . Smokeless tobacco: Never Used  Substance Use Topics  . Alcohol use: No  . Drug use: No   Medication:    Current Outpatient Medications:  .  Biotin 10 MG CAPS, Take by mouth daily., Disp: , Rfl:  .  gabapentin (NEURONTIN) 300 MG capsule, Take 1 capsule (300 mg total) by mouth 4 (four) times daily., Disp: 120 capsule, Rfl: 4 .  gabapentin (NEURONTIN) 300 MG capsule, Take 1 capsule (300 mg total) by mouth 4 (four) times daily., Disp: 120 capsule, Rfl: 1 .  meclizine (ANTIVERT) 25 MG tablet, Take 1 tablet (25 mg total) by mouth 3 (three) times daily as needed for dizziness., Disp: 30 tablet, Rfl: 0 .  Multiple Vitamin (MULTIVITAMIN) tablet, Take 1 tablet by mouth daily., Disp: , Rfl:  .  omeprazole  (PRILOSEC) 20 MG capsule, TAKE 1 TABLET BY MOUTH ONCE DAILY, Disp: 90 capsule, Rfl: 0 .  traMADol (ULTRAM) 50 MG tablet, TAKE 1 TABLET BY MOUTH EVERY 12 HOURS AS NEEDED, Disp: 30 tablet, Rfl: 5 .  traMADol (ULTRAM) 50 MG tablet, Take 1 tablet (50 mg total) by mouth every 12 (twelve) hours as needed., Disp: 30 tablet, Rfl: 0 .  traMADol (ULTRAM) 50 MG tablet, Take 1 tablet (50 mg total) by mouth every 12 (twelve) hours as needed., Disp: 30 tablet, Rfl: 1 .  vitamin B-12 (CYANOCOBALAMIN) 1000 MCG tablet, Take 1,000 mcg by mouth daily., Disp: , Rfl:    Allergies:  Sulfa antibiotics      LABORATORY PANEL:   CBC No results for input(s): WBC, HGB, HCT, PLT in the last 168 hours. ------------------------------------------------------------------------------------------------------------------  Chemistries  No results for input(s): NA, K, CL, CO2, GLUCOSE, BUN, CREATININE, CALCIUM, MG, AST, ALT, ALKPHOS, BILITOT in the last 168 hours.  Invalid input(s): GFRCGP ------------------------------------------------------------------------------------------------------------------  Cardiac Enzymes No results for input(s): TROPONINI in the last 168 hours. ------------------------------------------------------------  RADIOLOGY:  No results found.     Thank  you for the consultation and for allowing [facility] to assist in the care of your patient. Our recommendations are noted above.  Please contact us if we can be of further service.  Wells Guiles, M.D., F.C.C.P.  Board Certified in Internal Medicine, Pulmonary Medicine, Critical Care Medicine, and Sleep Medicine.  [facility] Office Number: [phone]   09/11/2018

## 2018-09-12 ENCOUNTER — Ambulatory Visit: Payer: Self-pay | Admitting: Internal Medicine

## 2018-10-01 ENCOUNTER — Other Ambulatory Visit: Payer: Self-pay | Admitting: Family Medicine
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
## 2018-12-30 ENCOUNTER — Other Ambulatory Visit: Payer: Self-pay | Admitting: Family Medicine

## 2019-03-30 ENCOUNTER — Other Ambulatory Visit: Payer: Self-pay | Admitting: Family Medicine

## 2019-04-06 ENCOUNTER — Ambulatory Visit: Payer: BC Managed Care – PPO | Admitting: Family Medicine

## 2019-05-11 ENCOUNTER — Encounter: Payer: BC Managed Care – PPO | Admitting: Family Medicine

## 2019-06-12 ENCOUNTER — Ambulatory Visit (INDEPENDENT_AMBULATORY_CARE_PROVIDER_SITE_OTHER): Payer: BC Managed Care – PPO | Admitting: Family Medicine

## 2019-06-12 ENCOUNTER — Other Ambulatory Visit: Payer: Self-pay

## 2019-06-12 ENCOUNTER — Encounter: Payer: Self-pay | Admitting: Family Medicine

## 2019-06-12 VITALS — BP 116/84 | HR 78 | Temp 97.3°F | Ht 69.0 in | Wt 216.2 lb

## 2019-06-12 DIAGNOSIS — Z8042 Family history of malignant neoplasm of prostate: Secondary | ICD-10-CM

## 2019-06-12 DIAGNOSIS — R739 Hyperglycemia, unspecified: Secondary | ICD-10-CM | POA: Diagnosis not present

## 2019-06-12 DIAGNOSIS — B001 Herpesviral vesicular dermatitis: Secondary | ICD-10-CM

## 2019-06-12 DIAGNOSIS — Z0000 Encounter for general adult medical examination without abnormal findings: Secondary | ICD-10-CM | POA: Diagnosis not present

## 2019-06-12 DIAGNOSIS — Z8719 Personal history of other diseases of the digestive system: Secondary | ICD-10-CM

## 2019-06-12 DIAGNOSIS — F988 Other specified behavioral and emotional disorders with onset usually occurring in childhood and adolescence: Secondary | ICD-10-CM

## 2019-06-12 DIAGNOSIS — Z7189 Other specified counseling: Secondary | ICD-10-CM

## 2019-06-12 DIAGNOSIS — Z0001 Encounter for general adult medical examination with abnormal findings: Secondary | ICD-10-CM

## 2019-06-12 DIAGNOSIS — Z2821 Immunization not carried out because of patient refusal: Secondary | ICD-10-CM

## 2019-06-12 LAB — LIPID PANEL
Cholesterol: 179 mg/dL (ref 0–200)
HDL: 43.4 mg/dL (ref >39.00)
LDL Cholesterol: 101 mg/dL — ABNORMAL HIGH (ref 0–99)
NonHDL: 135.18
Total CHOL/HDL Ratio: 4
Triglycerides: 172 mg/dL — ABNORMAL HIGH (ref 0.0–149.0)
VLDL: 34.4 mg/dL (ref 0.0–40.0)

## 2019-06-12 LAB — GLUCOSE, RANDOM: Glucose, Bld: 103 mg/dL — ABNORMAL HIGH (ref 70–99)

## 2019-06-12 MED ORDER — AMPHETAMINE-DEXTROAMPHETAMINE 10 MG PO TABS: 5.0000 mg | ORAL_TABLET | Freq: Two times a day (BID) | ORAL | 0 refills | Status: DC | PRN

## 2019-06-12 MED ORDER — OMEPRAZOLE 20 MG PO CPDR: DELAYED_RELEASE_CAPSULE | ORAL | 3 refills | Status: DC

## 2019-06-12 NOTE — Patient Instructions (Addendum)
Go to the lab on the way out.  We'll contact you with your lab report. Take care.  Glad to see you.  Try adderall as needed.  Start with 1/2 tab and gradually work up if needed.   Update me in about 10 days, sooner if needed.   I would get a flu shot each fall.

## 2019-06-12 NOTE — Progress Notes (Signed)
This visit occurred during the SARS-CoV-2 public health emergency.  Safety protocols were in place, including screening questions prior to the visit, additional usage of staff PPE, and extensive cleaning of exam room while observing appropriate contact time as indicated for disinfecting solutions.  CPE- See plan.  Routine anticipatory guidance given to patient.  See health maintenance.  The possibility exists that previously documented standard health maintenance information may have been brought forward from a previous encounter into this note.  If needed, that same information has been updated to reflect the current situation based on today's encounter.   Tetanus 2016.   Flu prev encouraged.  declined.  We talked about this.  He does not have a reason that he can state why he would not get vaccinated.  His employer even offers it at work.  I told him I want him to think about this. PNA and shingles not due Colon and prostate cancer screening not due.  Living will d/w pt.  Wife designated if patient were incapacitated.  Diet and exercise d/w pt.   prev intentional weight loss noted.    Labs pending, routine lipid and sugar pending.  D/w pt.    He bought a new house and they are adjusting to that.    He has been in counseling.  He had ADD testing prev.  He has been working on lists, etc.  He is trying to compensate.  His is having more lapses of concentration and that is making his work situation more difficult.  It has gotten to the point where he wanted to consider treatment.  Discussed.  Still on PPI for GERD with some relief, failed taper off medicine.   Still with some occ burping.  H/o dilation.  He has noted some pressure sensation in his throat while squatting over but it is better recently.  Brief, no cough.  No syncope.    H/o cold sores.  He used abreva.  That helped.  No adverse effect on medication.  PMH and SH reviewed  Meds, vitals, and allergies reviewed.   ROS: Per HPI.   Unless specifically indicated otherwise in HPI, the patient denies:  General: fever. Eyes: acute vision changes ENT: sore throat Cardiovascular: chest pain Respiratory: SOB GI: vomiting GU: dysuria Musculoskeletal: acute back pain Derm: acute rash Neuro: acute motor dysfunction Psych: worsening mood Endocrine: polydipsia Heme: bleeding Allergy: hayfever  GEN: nad, alert and oriented HEENT:ncat NECK: supple w/o LA CV: rrr. PULM: ctab, no inc wob ABD: soft, +bs EXT: no edema SKIN: no acute rash CN 2-12 wnl B, S/S/DTR wnl x4

## 2019-06-13 DIAGNOSIS — F988 Other specified behavioral and emotional disorders with onset usually occurring in childhood and adolescence: Secondary | ICD-10-CM | POA: Insufficient documentation

## 2019-06-13 DIAGNOSIS — Z2821 Immunization not carried out because of patient refusal: Secondary | ICD-10-CM | POA: Insufficient documentation

## 2019-06-13 NOTE — Assessment & Plan Note (Signed)
Living will d/w pt.  Wife designated if patient were incapacitated.   ?

## 2019-06-13 NOTE — Assessment & Plan Note (Signed)
Still on PPI for GERD with some relief, failed taper off medicine.   Still with some occ burping.  H/o dilation.  He has noted some pressure sensation in his throat while squatting over but it is better recently.  Brief, no cough.  No syncope.  Would continue as is.  He agrees.  He will update me as needed.
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
## 2019-06-13 NOTE — Assessment & Plan Note (Signed)
Tetanus 2016.   Flu prev encouraged.  declined.  We talked about this.  He does not have a reason that he can state why he would not get vaccinated.  His employer even offers it at work.  I told him I want him to think about this. PNA and shingles not due Colon and prostate cancer screening not due.  Living will d/w pt.  Wife designated if patient were incapacitated.  Diet and exercise d/w pt.   prev intentional weight loss noted.    Labs pending, routine lipid and sugar pending.  D/w pt.

## 2019-06-13 NOTE — Assessment & Plan Note (Signed)
H/o cold sores.  He used abreva.  That helped.  No adverse effect on medication.

## 2019-06-21 ENCOUNTER — Encounter: Payer: Self-pay | Admitting: Family Medicine

## 2019-06-24 ENCOUNTER — Other Ambulatory Visit: Payer: Self-pay | Admitting: Family Medicine

## 2019-06-27 ENCOUNTER — Other Ambulatory Visit: Payer: Self-pay | Admitting: Family Medicine

## 2019-06-27 MED ORDER — AMPHETAMINE-DEXTROAMPHET ER 30 MG PO CP24: 30.0000 mg | ORAL_CAPSULE | ORAL | 0 refills | Status: DC

## 2019-06-27 NOTE — Telephone Encounter (Signed)
Pt left a note in the MyChart refill request stating he was told to send a request through MyChart for the change to an Extended Release dose.

## 2019-06-27 NOTE — Telephone Encounter (Signed)
Prescription changed.  Prescription sent.  MyChart message sent to patient.

## 2019-06-28 ENCOUNTER — Other Ambulatory Visit: Payer: Self-pay | Admitting: Family Medicine

## 2019-07-14 ENCOUNTER — Encounter: Payer: Self-pay | Admitting: Family Medicine

## 2019-07-14 ENCOUNTER — Other Ambulatory Visit: Payer: Self-pay | Admitting: Family Medicine

## 2019-07-16 NOTE — Telephone Encounter (Signed)
There is a MyChart message regarding this request  Name of Medication: amphetamine-dextroamphetamine (ADDERALL XR) 30 mg  Name of Pharmacy: [facility]  Last Fill or Written Date and Quantity: 06/27/2019 #30 with 0 refill  Last Office Visit and Type: 06/12/2019 (CPE)  Next Office Visit and Type:   Last Controlled Substance Agreement Date:   Last UDS:

## 2019-07-17 MED ORDER — AMPHETAMINE-DEXTROAMPHET ER 30 MG PO CP24: 30.0000 mg | ORAL_CAPSULE | ORAL | 0 refills | Status: DC

## 2019-07-17 NOTE — Telephone Encounter (Signed)
Sent!

## 2019-08-18 ENCOUNTER — Other Ambulatory Visit: Payer: Self-pay | Admitting: Family Medicine

## 2019-08-20 NOTE — Telephone Encounter (Signed)
Electronic refill request. Adderall XR Last office visit:   06/12/2019 Last Filled:    30 capsule 0 07/17/2019  Please advise.

## 2019-08-21 ENCOUNTER — Other Ambulatory Visit: Payer: Self-pay | Admitting: Family Medicine

## 2019-08-21 MED ORDER — AMPHETAMINE-DEXTROAMPHET ER 30 MG PO CP24: 30.0000 mg | ORAL_CAPSULE | ORAL | 0 refills | Status: DC

## 2019-08-21 NOTE — Telephone Encounter (Signed)
Sent. Thanks.   

## 2019-08-21 NOTE — Telephone Encounter (Signed)
rx sent on other phone note.  Thanks.

## 2019-08-21 NOTE — Telephone Encounter (Signed)
This was requested previously but has not been approved yet but Epic will not let me refuse the Rx.

## 2019-09-10 ENCOUNTER — Other Ambulatory Visit: Payer: Self-pay | Admitting: Family Medicine

## 2019-09-10 NOTE — Telephone Encounter (Signed)
Electronic refill request. Adderall Last office visit:   06/12/2019 Last Filled:    30 capsule 0 08/21/2019  Please advise.

## 2019-09-11 MED ORDER — AMPHETAMINE-DEXTROAMPHET ER 30 MG PO CP24: 30.0000 mg | ORAL_CAPSULE | ORAL | 0 refills | Status: DC

## 2019-09-11 NOTE — Telephone Encounter (Signed)
Sent. Thanks.   

## 2019-09-24 ENCOUNTER — Ambulatory Visit (INDEPENDENT_AMBULATORY_CARE_PROVIDER_SITE_OTHER)
Admission: RE | Admit: 2019-09-24 | Discharge: 2019-09-24 | Disposition: A | Discharge status: Home / Self Care | Payer: BC Managed Care – PPO | Source: Ambulatory Visit

## 2019-09-24 DIAGNOSIS — R0982 Postnasal drip: Secondary | ICD-10-CM

## 2019-09-24 DIAGNOSIS — R0789 Other chest pain: Secondary | ICD-10-CM

## 2019-09-24 DIAGNOSIS — R059 Cough, unspecified: Secondary | ICD-10-CM

## 2019-09-24 DIAGNOSIS — R0989 Other specified symptoms and signs involving the circulatory and respiratory systems: Secondary | ICD-10-CM

## 2019-09-24 DIAGNOSIS — M25512 Pain in left shoulder: Secondary | ICD-10-CM

## 2019-09-24 DIAGNOSIS — R6889 Other general symptoms and signs: Secondary | ICD-10-CM

## 2019-09-24 DIAGNOSIS — G8929 Other chronic pain: Secondary | ICD-10-CM

## 2019-09-24 DIAGNOSIS — R05 Cough: Secondary | ICD-10-CM

## 2019-09-24 MED ORDER — CETIRIZINE HCL 10 MG PO TABS: 10.0000 mg | ORAL_TABLET | Freq: Every day | ORAL | 0 refills | Status: DC

## 2019-09-24 MED ORDER — PSEUDOEPHEDRINE HCL 60 MG PO TABS: 60.0000 mg | ORAL_TABLET | Freq: Three times a day (TID) | ORAL | 0 refills | Status: DC | PRN

## 2019-09-24 NOTE — ED Provider Notes (Signed)
Virtual Visit via Video Note:  Brian Maldonado  initiated request for Telemedicine visit with [facility] team. I connected with Brian Maldonado  on 09/24/2019 at 12:18 PM  for a synchronized telemedicine visit using a video enabled HIPPA compliant telemedicine application. I verified that I am speaking with Brian Maldonado  using two identifiers. Brian Eagles, Brian Maldonado  was physically located in a [facility]gent care site and Brian Maldonado was located at a different location.   The limitations of evaluation and management by telemedicine as well as the availability of in-person appointments were discussed. Patient was informed that he  may incur a bill ( including co-pay) for this virtual visit encounter. Brian Maldonado  expressed understanding and gave verbal consent to proceed with virtual visit.     History of Present Illness:Brian Maldonado  is a 34 y.o. male presents with 3-4 week hx of persistent sensation of congestion/heaviness in his chest and throat. Feels a need to cough to try and get this mucus out but otherwise does not have the sensation of coughing. Denies smoking cigarettes. Denies hx of asthma. Has had mild intermittent allergies in the past. Has not tried medications for allergies.  Denies family history of early MI, heart arrhythmias.  Patient does admit that he was feeling a bit better today and wanted to appease his wife who is concerned about what could be going on.  Patient does work from home, Network engineer social distancing.   Review of Systems  Constitutional: Negative for fever and malaise/fatigue.  HENT: Positive for congestion and sore throat. Negative for ear pain and sinus pain.   Eyes: Negative for discharge and redness.  Respiratory: Positive for cough. Negative for hemoptysis, shortness of breath and wheezing.   Cardiovascular: Positive for chest pain (very focal, mid-lateral sternum that radiates diagnally toward left shoulder).  Gastrointestinal:  Negative for abdominal pain, diarrhea, nausea and vomiting.  Genitourinary: Negative for dysuria, flank pain and hematuria.  Musculoskeletal: Positive for joint pain (left shoulder, chronic, has a hx of surgery) and myalgias.  Skin: Negative for rash.  Neurological: Negative for dizziness, weakness and headaches.  Psychiatric/Behavioral: Negative for depression and substance abuse.    No current facility-administered medications for this encounter.   Current Outpatient Medications  Medication Sig Dispense Refill  . amphetamine-dextroamphetamine (ADDERALL XR) 30 MG 24 hr capsule Take 1 capsule (30 mg total) by mouth every morning. 30 capsule 0  . Glutamine 500 MG TABS Take by mouth every morning.    . Multiple Vitamin (MULTIVITAMIN) tablet Take 1 tablet by mouth daily.    Marland Kitchen omeprazole (PRILOSEC) 20 MG capsule TAKE 1 CAPSULE BY MOUTH EVERY DAY 90 capsule 3  . OVER THE COUNTER MEDICATION Branch Chain Amino Acids - after workouts.    . vitamin B-12 (CYANOCOBALAMIN) 1000 MCG tablet Take 1,000 mcg by mouth daily.       Allergies  Allergen Reactions  . Sulfa Antibiotics     Unknown;  Had as a child and does not recall reaction     Past Medical History:  Diagnosis Date  . Cervicogenic headache 07/01/2015   Left side  . GERD (gastroesophageal reflux disease)   . Headache   . Left shoulder pain   . S/P dilatation of esophageal stricture   . Sleep apnea 2018    Past Surgical History:  Procedure Laterality Date  . ESOPHAGEAL DILATION    . LIPOMA EXCISION    . SHOULDER ARTHROSCOPY  Observations/Objective: Physical Exam Constitutional:      General: He is not in acute distress.    Appearance: Normal appearance. He is not ill-appearing, toxic-appearing or diaphoretic.  HENT:     Head: Atraumatic.  Eyes:     General: No scleral icterus.       Right eye: No discharge.        Left eye: No discharge.     Extraocular Movements: Extraocular movements intact.  Pulmonary:      Effort: Pulmonary effort is normal.     Comments: Does not have pursed lips, or appear to have tachypnea, use of accessory muscles. Chest:    Neurological:     Mental Status: He is alert and oriented to person, place, and time.  Psychiatric:        Mood and Affect: Mood normal.        Behavior: Behavior normal.        Thought Content: Thought content normal.        Judgment: Judgment normal.     Assessment and Plan:  1. Atypical chest pain   2. Cough   3. Chest congestion   4. Throat congestion   5. Post-nasal drainage   6. Chronic left shoulder pain    Discussed differential with patient which could include pneumonia, chest strain, allergic rhinitis/postnasal drainage, bronchospasm.  He is very well-appearing and would like to use conservative management.  Will have him start Zyrtec daily with pseudoephedrine as needed.  Use conservative management for musculoskeletal pain.  If symptoms persist then present to clinic for an in person evaluation, consideration for chest x-ray. Counseled patient on potential for adverse effects with medications prescribed/recommended today, ER and return-to-clinic precautions discussed, patient verbalized understanding.    Follow Up Instructions:    I discussed the assessment and treatment plan with the patient. The patient was provided an opportunity to ask questions and all were answered. The patient agreed with the plan and demonstrated an understanding of the instructions.   The patient was advised to call back or seek an in-person evaluation if the symptoms worsen or if the condition fails to improve as anticipated.  I provided 15 minutes of non-face-to-face time during this encounter.    Wallis Bamberg, Brian Maldonado  09/24/2019 12:18 PM         Wallis Bamberg, Brian Maldonado 09/24/19 1236

## 2019-10-15 ENCOUNTER — Other Ambulatory Visit: Payer: Self-pay | Admitting: Family Medicine

## 2019-10-15 NOTE — Telephone Encounter (Signed)
Electronic refill request. Adderall XR Last office visit:   06/12/2019 Last Filled:    30 capsule 0 09/11/2019  Please advise.

## 2019-10-16 MED ORDER — AMPHETAMINE-DEXTROAMPHET ER 30 MG PO CP24: 30.0000 mg | ORAL_CAPSULE | ORAL | 0 refills | Status: DC

## 2019-10-16 NOTE — Telephone Encounter (Signed)
Sent. Thanks.   

## 2019-11-12 ENCOUNTER — Other Ambulatory Visit: Payer: Self-pay | Admitting: Family Medicine

## 2019-11-12 MED ORDER — AMPHETAMINE-DEXTROAMPHET ER 30 MG PO CP24: 30.0000 mg | ORAL_CAPSULE | ORAL | 0 refills | Status: DC

## 2019-11-12 NOTE — Telephone Encounter (Signed)
Sent. Thanks.   

## 2019-11-12 NOTE — Telephone Encounter (Signed)
Last filled 10-16-19 #30 Last OV 06-02-19 No Future OV [facility]

## 2019-12-12 ENCOUNTER — Other Ambulatory Visit: Payer: Self-pay | Admitting: Family Medicine

## 2019-12-12 MED ORDER — AMPHETAMINE-DEXTROAMPHET ER 30 MG PO CP24: 30.0000 mg | ORAL_CAPSULE | ORAL | 0 refills | Status: DC

## 2019-12-12 NOTE — Telephone Encounter (Signed)
Sent. Thanks.   

## 2019-12-12 NOTE — Telephone Encounter (Signed)
Refill request Adderall XR Last refill 11/12/19 #30 Last office visit 06/12/19

## 2020-01-11 ENCOUNTER — Other Ambulatory Visit: Payer: Self-pay | Admitting: Family Medicine

## 2020-01-11 NOTE — Telephone Encounter (Signed)
Electronic refill request. Adderall XR Last office visit:   06/12/2019 Last Filled:     30 capsule 0 12/12/2019  Please advise.

## 2020-01-13 MED ORDER — AMPHETAMINE-DEXTROAMPHET ER 30 MG PO CP24: 30.0000 mg | ORAL_CAPSULE | ORAL | 0 refills | Status: DC

## 2020-01-13 NOTE — Telephone Encounter (Signed)
Sent. Thanks.   

## 2020-02-11 ENCOUNTER — Other Ambulatory Visit: Payer: Self-pay | Admitting: Family Medicine

## 2020-02-11 MED ORDER — AMPHETAMINE-DEXTROAMPHET ER 30 MG PO CP24: 30.0000 mg | ORAL_CAPSULE | ORAL | 0 refills | Status: DC

## 2020-02-11 NOTE — Telephone Encounter (Signed)
Electronic refill request. Adderall XR 30 mg Last office visit:   06/12/2019 Last Filled:    30 capsule 0 01/13/2020  Please advise.

## 2020-02-11 NOTE — Telephone Encounter (Signed)
Sent. Thanks.   

## 2020-02-11 NOTE — Telephone Encounter (Deleted)
Electronic refill request. Adderall XR Last office visit:  06/12/2019 Last Filled:  #30 on 01/13/2020 Please advise.

## 2020-02-18 ENCOUNTER — Other Ambulatory Visit: Payer: Self-pay | Admitting: Family Medicine

## 2020-02-19 NOTE — Telephone Encounter (Signed)
Electronic refill request. Adderall XR 30 mg Last office visit:   06/12/2019 Last Filled:    30 capsule 0 02/11/2020

## 2020-02-20 NOTE — Telephone Encounter (Signed)
Sent. Thanks.   

## 2020-03-10 DIAGNOSIS — M222X2 Patellofemoral disorders, left knee: Secondary | ICD-10-CM | POA: Diagnosis not present

## 2020-03-13 ENCOUNTER — Other Ambulatory Visit: Payer: Self-pay | Admitting: Family Medicine

## 2020-03-13 DIAGNOSIS — Z03818 Encounter for observation for suspected exposure to other biological agents ruled out: Secondary | ICD-10-CM | POA: Diagnosis not present

## 2020-03-13 NOTE — Telephone Encounter (Signed)
Electronic refill request. Adderall XR Last office visit:   06/12/2019 Last Filled:    30 capsule 0 02/20/2020

## 2020-03-14 NOTE — Telephone Encounter (Signed)
Patient advised.

## 2020-03-14 NOTE — Telephone Encounter (Signed)
He should already have rx on file at pharmacy for this month.  Please have him request a new prescription next month.

## 2020-03-20 DIAGNOSIS — Z03818 Encounter for observation for suspected exposure to other biological agents ruled out: Secondary | ICD-10-CM | POA: Diagnosis not present

## 2020-03-27 DIAGNOSIS — Z20822 Contact with and (suspected) exposure to covid-19: Secondary | ICD-10-CM | POA: Diagnosis not present

## 2020-04-03 DIAGNOSIS — Z03818 Encounter for observation for suspected exposure to other biological agents ruled out: Secondary | ICD-10-CM | POA: Diagnosis not present

## 2020-04-10 DIAGNOSIS — Z03818 Encounter for observation for suspected exposure to other biological agents ruled out: Secondary | ICD-10-CM | POA: Diagnosis not present

## 2020-04-14 ENCOUNTER — Other Ambulatory Visit: Payer: Self-pay | Admitting: Family Medicine

## 2020-04-14 NOTE — Telephone Encounter (Signed)
Last OV 06/12/19 Last Fill 02/20/20  #30/0

## 2020-04-14 NOTE — Telephone Encounter (Signed)
Sent. Thanks.   

## 2020-04-17 DIAGNOSIS — Z03818 Encounter for observation for suspected exposure to other biological agents ruled out: Secondary | ICD-10-CM | POA: Diagnosis not present

## 2020-04-23 DIAGNOSIS — Z03818 Encounter for observation for suspected exposure to other biological agents ruled out: Secondary | ICD-10-CM | POA: Diagnosis not present

## 2020-05-08 DIAGNOSIS — Z03818 Encounter for observation for suspected exposure to other biological agents ruled out: Secondary | ICD-10-CM | POA: Diagnosis not present

## 2020-05-13 ENCOUNTER — Other Ambulatory Visit: Payer: Self-pay | Admitting: Family Medicine

## 2020-05-13 NOTE — Telephone Encounter (Signed)
Pharmacy requests refill on: Adderall XR 30 mg 24 hr capsule   LAST REFILL: 04/14/2020 LAST OV: 06/12/2019 NEXT OV: Not Scheduled PHARMACY: [facility]

## 2020-05-14 MED ORDER — ADDERALL XR 30 MG PO CP24: 30.0000 mg | ORAL_CAPSULE | Freq: Every day | ORAL | 0 refills | Status: DC

## 2020-05-14 NOTE — Telephone Encounter (Signed)
Sent. Thanks.   

## 2020-05-15 DIAGNOSIS — Z03818 Encounter for observation for suspected exposure to other biological agents ruled out: Secondary | ICD-10-CM | POA: Diagnosis not present

## 2020-05-26 DIAGNOSIS — Z03818 Encounter for observation for suspected exposure to other biological agents ruled out: Secondary | ICD-10-CM | POA: Diagnosis not present

## 2020-06-03 DIAGNOSIS — Z03818 Encounter for observation for suspected exposure to other biological agents ruled out: Secondary | ICD-10-CM | POA: Diagnosis not present

## 2020-06-10 DIAGNOSIS — Z03818 Encounter for observation for suspected exposure to other biological agents ruled out: Secondary | ICD-10-CM | POA: Diagnosis not present

## 2020-06-12 ENCOUNTER — Other Ambulatory Visit: Payer: Self-pay | Admitting: Family Medicine

## 2020-06-13 NOTE — Telephone Encounter (Signed)
Pharmacy requests refill on: Adderall XR 30 mg   LAST REFILL: 05/14/2020 (Q-30, R-0) LAST OV: 06/12/2019 NEXT OV: Not Scheduled  PHARMACY: [facility] [place], [place]   Patient needs to schedule CPE for further refills.

## 2020-06-15 MED ORDER — ADDERALL XR 30 MG PO CP24: 30.0000 mg | ORAL_CAPSULE | Freq: Every day | ORAL | 0 refills | Status: DC

## 2020-06-15 NOTE — Telephone Encounter (Signed)
Sent. Thanks.  Please schedule CPE.   

## 2020-07-02 DIAGNOSIS — Z03818 Encounter for observation for suspected exposure to other biological agents ruled out: Secondary | ICD-10-CM | POA: Diagnosis not present

## 2020-07-08 DIAGNOSIS — Z20822 Contact with and (suspected) exposure to covid-19: Secondary | ICD-10-CM | POA: Diagnosis not present

## 2020-07-08 DIAGNOSIS — Z03818 Encounter for observation for suspected exposure to other biological agents ruled out: Secondary | ICD-10-CM | POA: Diagnosis not present

## 2020-07-14 ENCOUNTER — Other Ambulatory Visit: Payer: Self-pay | Admitting: Family Medicine

## 2020-07-15 DIAGNOSIS — Z20822 Contact with and (suspected) exposure to covid-19: Secondary | ICD-10-CM | POA: Diagnosis not present

## 2020-07-15 MED ORDER — ADDERALL XR 30 MG PO CP24: 30.0000 mg | ORAL_CAPSULE | Freq: Every day | ORAL | 0 refills | Status: DC

## 2020-07-15 NOTE — Telephone Encounter (Signed)
Please schedule CPE.  °Sent. Thanks.  ° °

## 2020-07-15 NOTE — Telephone Encounter (Signed)
Refill request for Adderall 30 mg caps.  LOV - 06/12/2019 Next OV - Not scheduled Last refilled - 06/15/20 #30/0  Patient was called last month per last note to schedule CPE.

## 2020-07-15 NOTE — Telephone Encounter (Signed)
Patient scheduled CPE for 08/08/20 at 3:30 pm.

## 2020-07-23 DIAGNOSIS — Z20822 Contact with and (suspected) exposure to covid-19: Secondary | ICD-10-CM | POA: Diagnosis not present

## 2020-07-29 DIAGNOSIS — Z1152 Encounter for screening for COVID-19: Secondary | ICD-10-CM | POA: Diagnosis not present

## 2020-08-04 DIAGNOSIS — Z20822 Contact with and (suspected) exposure to covid-19: Secondary | ICD-10-CM | POA: Diagnosis not present

## 2020-08-05 ENCOUNTER — Encounter: Payer: Self-pay | Admitting: Family Medicine

## 2020-08-07 NOTE — Telephone Encounter (Signed)
Patient is rescheduled for 08/22/20 . Thanks, EM

## 2020-08-07 NOTE — Telephone Encounter (Signed)
Please see my chart message. Given the timeline, I think he is going to need to reschedule his appointment for a later date, but I wanted to check with you first. I am presuming we are using 10 days from his test which would be after the 16th.   He is not symptomatic, but I still think it makes sense to push it back.   Clelia Croft

## 2020-08-08 ENCOUNTER — Encounter: Payer: BC Managed Care – PPO | Admitting: Family Medicine

## 2020-08-11 ENCOUNTER — Encounter: Payer: BC Managed Care – PPO | Admitting: Family Medicine

## 2020-08-13 ENCOUNTER — Other Ambulatory Visit: Payer: Self-pay | Admitting: Family Medicine

## 2020-08-13 MED ORDER — ADDERALL XR 30 MG PO CP24: 30.0000 mg | ORAL_CAPSULE | Freq: Every day | ORAL | 0 refills | Status: DC

## 2020-08-13 NOTE — Telephone Encounter (Signed)
Sent. Thanks.   

## 2020-08-13 NOTE — Telephone Encounter (Signed)
Refill request for Adderall XL 30 mg caps  LOV - 06/12/19 Next OV - 08/22/20 Last refilled - 07/15/20 #30/0

## 2020-08-14 DIAGNOSIS — Z1152 Encounter for screening for COVID-19: Secondary | ICD-10-CM | POA: Diagnosis not present

## 2020-08-22 ENCOUNTER — Ambulatory Visit (INDEPENDENT_AMBULATORY_CARE_PROVIDER_SITE_OTHER): Payer: BC Managed Care – PPO | Admitting: Family Medicine

## 2020-08-22 ENCOUNTER — Other Ambulatory Visit: Payer: Self-pay

## 2020-08-22 ENCOUNTER — Encounter: Payer: Self-pay | Admitting: Family Medicine

## 2020-08-22 VITALS — BP 104/60 | HR 72 | Temp 97.6°F | Ht 69.0 in | Wt 184.0 lb

## 2020-08-22 DIAGNOSIS — G5600 Carpal tunnel syndrome, unspecified upper limb: Secondary | ICD-10-CM

## 2020-08-22 DIAGNOSIS — Z0001 Encounter for general adult medical examination with abnormal findings: Secondary | ICD-10-CM

## 2020-08-22 DIAGNOSIS — Z9889 Other specified postprocedural states: Secondary | ICD-10-CM

## 2020-08-22 DIAGNOSIS — I7300 Raynaud's syndrome without gangrene: Secondary | ICD-10-CM | POA: Diagnosis not present

## 2020-08-22 DIAGNOSIS — B001 Herpesviral vesicular dermatitis: Secondary | ICD-10-CM

## 2020-08-22 DIAGNOSIS — R0683 Snoring: Secondary | ICD-10-CM

## 2020-08-22 DIAGNOSIS — F988 Other specified behavioral and emotional disorders with onset usually occurring in childhood and adolescence: Secondary | ICD-10-CM

## 2020-08-22 DIAGNOSIS — R739 Hyperglycemia, unspecified: Secondary | ICD-10-CM | POA: Diagnosis not present

## 2020-08-22 DIAGNOSIS — E7800 Pure hypercholesterolemia, unspecified: Secondary | ICD-10-CM | POA: Diagnosis not present

## 2020-08-22 DIAGNOSIS — Z8719 Personal history of other diseases of the digestive system: Secondary | ICD-10-CM

## 2020-08-22 DIAGNOSIS — Z7189 Other specified counseling: Secondary | ICD-10-CM

## 2020-08-22 DIAGNOSIS — R29898 Other symptoms and signs involving the musculoskeletal system: Secondary | ICD-10-CM

## 2020-08-22 LAB — BASIC METABOLIC PANEL
BUN: 39 mg/dL — ABNORMAL HIGH (ref 6–23)
CO2: 27 mEq/L (ref 19–32)
Calcium: 9.8 mg/dL (ref 8.4–10.5)
Chloride: 101 mEq/L (ref 96–112)
Creatinine, Ser: 1.12 mg/dL (ref 0.40–1.50)
GFR: 85.65 mL/min (ref >60.00)
Glucose, Bld: 93 mg/dL (ref 70–99)
Potassium: 4.1 mEq/L (ref 3.5–5.1)
Sodium: 135 mEq/L (ref 135–145)

## 2020-08-22 LAB — CBC WITH DIFFERENTIAL/PLATELET
Basophils Absolute: 0 10*3/uL (ref 0.0–0.1)
Basophils Relative: 0.3 % (ref 0.0–3.0)
Eosinophils Absolute: 0 10*3/uL (ref 0.0–0.7)
Eosinophils Relative: 0.4 % (ref 0.0–5.0)
HCT: 40.9 % (ref 39.0–52.0)
Hemoglobin: 14 g/dL (ref 13.0–17.0)
Lymphocytes Relative: 11.8 % — ABNORMAL LOW (ref 12.0–46.0)
Lymphs Abs: 1.1 10*3/uL (ref 0.7–4.0)
MCHC: 34.2 g/dL (ref 30.0–36.0)
MCV: 92.3 fl (ref 78.0–100.0)
Monocytes Absolute: 0.7 10*3/uL (ref 0.1–1.0)
Monocytes Relative: 7 % (ref 3.0–12.0)
Neutro Abs: 7.8 10*3/uL — ABNORMAL HIGH (ref 1.4–7.7)
Neutrophils Relative %: 80.5 % — ABNORMAL HIGH (ref 43.0–77.0)
Platelets: 188 10*3/uL (ref 150.0–400.0)
RBC: 4.43 Mil/uL (ref 4.22–5.81)
RDW: 12.8 % (ref 11.5–15.5)
WBC: 9.7 10*3/uL (ref 4.0–10.5)

## 2020-08-22 LAB — LIPID PANEL
Cholesterol: 126 mg/dL (ref 0–200)
HDL: 45.7 mg/dL (ref >39.00)
LDL Cholesterol: 58 mg/dL (ref 0–99)
NonHDL: 80.75
Total CHOL/HDL Ratio: 3
Triglycerides: 115 mg/dL (ref 0.0–149.0)
VLDL: 23 mg/dL (ref 0.0–40.0)

## 2020-08-22 LAB — TSH: TSH: 1.24 u[IU]/mL (ref 0.35–4.50)

## 2020-08-22 NOTE — Progress Notes (Signed)
This visit occurred during the SARS-CoV-2 public health emergency.  Safety protocols were in place, including screening questions prior to the visit, additional usage of staff PPE, and extensive cleaning of exam room while observing appropriate contact time as indicated for disinfecting solutions.  CPE- See plan.  Routine anticipatory guidance given to patient.  See health maintenance.  The possibility exists that previously documented standard health maintenance information may have been brought forward from a previous encounter into this note.  If needed, that same information has been updated to reflect the current situation based on today's encounter.    Tetanus 2016.  Flu prev encouraged. To be done at work.   covid vaccine encouraged.  Discussed. PNA and shingles not due Colon and prostate cancer screening not due.  Living will d/w pt. Wife designated if patient were incapacitated.   He had asymptomatic covid, d/w pt.    He has intentional weight loss with diet and exercise.    He had ADD testing prev.  He has been working on lists, etc.  He is trying to compensate.  Adderall helped.  He can tell a change if off med.  He uses on most work days.  It improved attention.  No ADE on med.  He wanted to continue.  L TMJ clicking.  He doesn't have pain chewing.  I asked him to check with dental clinic.  He agrees.  Still on prn PPI for GERD with relief.  He improved with weight loss.  S/p dilation prev.  Discussed.  Sleep apnea improved with weight loss.  Snoring improved in the meantime.  Uses CPAP less often but sleeping better overall with nasal strips.    He is putting up with CTS, with bracing/stretching/icing.  D/w pt about ortho eval if worse.  He'll update me as needed.    H/o cold sores.  He used abreva.  That works.    No ADE on med.  His fingertips change with cold exposure.  Locally tender and lighter colored when he has symptoms, better with warming.  Bilateral.  No sx  noted in the feet.    He is working with DSS now in [facility], doing medicaid eligibility reviews.  I thanked him for his effort.  PMH and SH reviewed  Meds, vitals, and allergies reviewed.   ROS: Per HPI.  Unless specifically indicated otherwise in HPI, the patient denies:  General: fever. Eyes: acute vision changes ENT: sore throat Cardiovascular: chest pain Respiratory: SOB GI: vomiting GU: dysuria Musculoskeletal: acute back pain Derm: acute rash Neuro: acute motor dysfunction Psych: worsening mood Endocrine: polydipsia Heme: bleeding Allergy: hayfever  GEN: nad, alert and oriented HEENT: ncat, left TMJ click noted.  Not tender. NECK: supple w/o LA CV: rrr. PULM: ctab, no inc wob ABD: soft, +bs EXT: no edema SKIN: no acute rash Normal cap refill and radial pulses B

## 2020-08-22 NOTE — Patient Instructions (Addendum)
It sounds like raynaud's phenomenon in your hands.  If more bothersome then let me know.   Take care.  Glad to see you.  Go to the lab on the way out.   If you have mychart we'll likely use that to update you.     Check with the dental clinic.

## 2020-08-24 DIAGNOSIS — R29898 Other symptoms and signs involving the musculoskeletal system: Secondary | ICD-10-CM | POA: Insufficient documentation

## 2020-08-24 DIAGNOSIS — I7300 Raynaud's syndrome without gangrene: Secondary | ICD-10-CM | POA: Insufficient documentation

## 2020-08-24 MED ORDER — ADDERALL XR 30 MG PO CP24: 30.0000 mg | ORAL_CAPSULE | Freq: Every day | ORAL | 0 refills | Status: DC

## 2020-08-24 NOTE — Assessment & Plan Note (Signed)
Continue Adderall with behavioral modification.

## 2020-08-24 NOTE — Assessment & Plan Note (Signed)
Tetanus 2016.  Flu prev encouraged. To be done at work.   covid vaccine encouraged.  Discussed. PNA and shingles not due Colon and prostate cancer screening not due.  Living will d/w pt. Wife designated if patient were incapacitated.   He had asymptomatic covid, d/w pt.    He has intentional weight loss with diet and exercise.

## 2020-08-24 NOTE — Assessment & Plan Note (Signed)
Reasonable to recheck routine labs today given his history.  Routine cautions given to patient, especially cold exposure.  His symptoms sound typical for Raynaud's phenomenon.  Discussed with patient.  He does not feel unwell otherwise and I do not suspect any ominous systemic process otherwise.

## 2020-08-24 NOTE — Assessment & Plan Note (Addendum)
He will update me if he wants an orthopedic referral.  Continue icing stretching bracing.

## 2020-08-24 NOTE — Assessment & Plan Note (Signed)
Sleep apnea improved with weight loss.  Snoring improved in the meantime.  Uses CPAP less often but sleeping better overall with nasal strips.

## 2020-08-24 NOTE — Assessment & Plan Note (Signed)
Continue as needed Abreva.

## 2020-08-24 NOTE — Assessment & Plan Note (Signed)
Living will d/w pt.  Wife designated if patient were incapacitated.   ?

## 2020-08-24 NOTE — Assessment & Plan Note (Signed)
GERD improved with weight loss.  He will update me as needed.

## 2020-08-24 NOTE — Assessment & Plan Note (Signed)
I asked him to follow-up with the dental clinic.

## 2020-09-17 ENCOUNTER — Encounter: Payer: Self-pay | Admitting: Family Medicine

## 2020-09-17 MED ORDER — AMPHETAMINE-DEXTROAMPHET ER 30 MG PO CP24: 30.0000 mg | ORAL_CAPSULE | Freq: Every day | ORAL | 0 refills | Status: DC

## 2020-09-17 NOTE — Telephone Encounter (Signed)
Pt left v/m that [facility] RX [facility] is name brand only; due to pts ins will not cover namebrand Adderall and pt request [facility]. Pt request cb or my chart note when done. Sending note to DR Para March.

## 2020-12-05 ENCOUNTER — Other Ambulatory Visit: Payer: Self-pay | Admitting: Family Medicine

## 2020-12-05 NOTE — Telephone Encounter (Signed)
Last written 08-22-20 #30 and 2 refills (3 rxs) Last OV 08-22-20 No Future OV [facility]

## 2020-12-07 MED ORDER — AMPHETAMINE-DEXTROAMPHET ER 30 MG PO CP24: 30.0000 mg | ORAL_CAPSULE | Freq: Every day | ORAL | 0 refills | Status: DC

## 2020-12-07 NOTE — Telephone Encounter (Signed)
Sent. Thanks.   

## 2021-01-06 ENCOUNTER — Encounter: Payer: Self-pay | Admitting: Family Medicine

## 2021-01-06 DIAGNOSIS — Z659 Problem related to unspecified psychosocial circumstances: Secondary | ICD-10-CM

## 2021-01-06 NOTE — Telephone Encounter (Signed)
I put in the referral.  Please see mychart message.  Thanks.

## 2021-01-06 NOTE — Telephone Encounter (Signed)
Called and spoke to pt to make sure he was not having any thoughts in harming himself 1st and foremost. He said no. He felt comfortable with talking to Bambi in the past. I advised him that referrals are behind and getting in with Behavioral Health can be more than a month out possibly. I offered to provide him with the information to the new [facility]. He declined it stating he thought he knew of some other places he could go but wanted to start with [employer]. He asked that Dr Para March go ahead and do the referral.

## 2021-01-09 ENCOUNTER — Telehealth: Payer: Self-pay | Admitting: *Deleted

## 2021-01-09 NOTE — Telephone Encounter (Signed)
-----   Message from Joaquim Nam, MD sent at 01/06/2021  2:00 PM EDT ----- Regarding: referral to [facility] If there is any way to expedite this referral, I would greatly appreciate it.  Please let me know what you hear.  Thanks.   Clelia Croft

## 2021-01-15 ENCOUNTER — Ambulatory Visit: Payer: BC Managed Care – PPO | Admitting: Psychology

## 2021-01-16 NOTE — Telephone Encounter (Signed)
See below.  Please try to contact patient and have him call about rescheduling.  Thanks. ==============   Chilton Si, Gretel Acre, CMA  You; Wendie Simmer B, CMA 4 minutes ago (1:38 PM)    Per [facility], the patient No Showed his Virtual appt - they are not sure when or if the provider will reach out to reschedule the patient. They have sent the Provider a message asking for assistance.   "According to the providers schedule this patient did not log on for their appt. "   The patient may need to just call back and see about rescheduling. I am not sure of their communication flow over there and I cannot see documentation in Epic - its blocked off.   -Ashtyn

## 2021-01-16 NOTE — Telephone Encounter (Signed)
See below.  No need to contact patient.  Thanks. ===================   Maisie Fus, CMA  You 2 minutes ago (1:44 PM)    Received this message shortly after replying to you - the patient is rescheduled   Hello Ashtyn, Mellody Dance got back to me and the patient was rescheduled for 7/25 for his initial appointment.   FYI

## 2021-01-19 ENCOUNTER — Ambulatory Visit (INDEPENDENT_AMBULATORY_CARE_PROVIDER_SITE_OTHER): Payer: BC Managed Care – PPO | Admitting: Psychology

## 2021-01-19 DIAGNOSIS — F411 Generalized anxiety disorder: Secondary | ICD-10-CM | POA: Diagnosis not present

## 2021-02-02 ENCOUNTER — Ambulatory Visit (INDEPENDENT_AMBULATORY_CARE_PROVIDER_SITE_OTHER): Payer: BC Managed Care – PPO | Admitting: Psychology

## 2021-02-02 DIAGNOSIS — F411 Generalized anxiety disorder: Secondary | ICD-10-CM

## 2021-02-18 ENCOUNTER — Ambulatory Visit (INDEPENDENT_AMBULATORY_CARE_PROVIDER_SITE_OTHER): Payer: BC Managed Care – PPO | Admitting: Psychology

## 2021-02-18 DIAGNOSIS — F411 Generalized anxiety disorder: Secondary | ICD-10-CM | POA: Diagnosis not present

## 2021-03-04 ENCOUNTER — Ambulatory Visit (INDEPENDENT_AMBULATORY_CARE_PROVIDER_SITE_OTHER): Payer: BC Managed Care – PPO | Admitting: Psychology

## 2021-03-04 DIAGNOSIS — F411 Generalized anxiety disorder: Secondary | ICD-10-CM | POA: Diagnosis not present

## 2021-03-12 ENCOUNTER — Other Ambulatory Visit: Payer: Self-pay | Admitting: Family Medicine

## 2021-03-12 NOTE — Telephone Encounter (Signed)
Last OV - 08/22/2020 Next OV - N/A Last Filled - 12/07/2020

## 2021-03-13 MED ORDER — AMPHETAMINE-DEXTROAMPHET ER 30 MG PO CP24: 30.0000 mg | ORAL_CAPSULE | Freq: Every day | ORAL | 0 refills | Status: DC

## 2021-03-13 NOTE — Telephone Encounter (Signed)
Sent. Thanks.   

## 2021-03-18 ENCOUNTER — Ambulatory Visit (INDEPENDENT_AMBULATORY_CARE_PROVIDER_SITE_OTHER): Payer: BC Managed Care – PPO | Admitting: Psychology

## 2021-03-18 DIAGNOSIS — F411 Generalized anxiety disorder: Secondary | ICD-10-CM

## 2021-04-01 ENCOUNTER — Ambulatory Visit (INDEPENDENT_AMBULATORY_CARE_PROVIDER_SITE_OTHER): Payer: BC Managed Care – PPO | Admitting: Psychology

## 2021-04-01 DIAGNOSIS — F411 Generalized anxiety disorder: Secondary | ICD-10-CM | POA: Diagnosis not present

## 2021-04-06 ENCOUNTER — Telehealth: Payer: BC Managed Care – PPO | Admitting: Family

## 2021-04-06 DIAGNOSIS — J208 Acute bronchitis due to other specified organisms: Secondary | ICD-10-CM | POA: Diagnosis not present

## 2021-04-06 DIAGNOSIS — B9689 Other specified bacterial agents as the cause of diseases classified elsewhere: Secondary | ICD-10-CM

## 2021-04-06 MED ORDER — AZITHROMYCIN 250 MG PO TABS: ORAL_TABLET | ORAL | 0 refills | Status: DC

## 2021-04-06 MED ORDER — PREDNISONE 10 MG (21) PO TBPK: ORAL_TABLET | ORAL | 0 refills | Status: DC

## 2021-04-06 NOTE — Progress Notes (Signed)
Virtual Visit Consent   Brian Maldonado, you are scheduled for a virtual visit with a [facility] provider today.     Just as with appointments in the office, your consent must be obtained to participate.  Your consent will be active for this visit and any virtual visit you may have with one of our providers in the next 365 days.     If you have a MyChart account, a copy of this consent can be sent to you electronically.  All virtual visits are billed to your insurance company just like a traditional visit in the office.    As this is a virtual visit, video technology does not allow for your provider to perform a traditional examination.  This may limit your provider's ability to fully assess your condition.  If your provider identifies any concerns that need to be evaluated in person or the need to arrange testing (such as labs, EKG, etc.), we will make arrangements to do so.     Although advances in technology are sophisticated, we cannot ensure that it will always work on either your end or our end.  If the connection with a video visit is poor, the visit may have to be switched to a telephone visit.  With either a video or telephone visit, we are not always able to ensure that we have a secure connection.     I need to obtain your verbal consent now.   Are you willing to proceed with your visit today?    BASIR NIVEN has provided verbal consent on 04/06/2021 for a virtual visit (video or telephone).   Jannifer Rodney, FNP   Date: 04/06/2021 7:26 PM   Virtual Visit via Video Note   I, Jannifer Rodney, connected with  Brian Maldonado  ([id number], [birth year]) on 04/06/21 at  7:30 PM EDT by a video-enabled telemedicine application and verified that I am speaking with the correct person using two identifiers.  Location: Patient: Virtual Visit Location Patient: Home Provider: Virtual Visit Location Provider: Home   I discussed the limitations of evaluation and management by telemedicine  and the availability of in person appointments. The patient expressed understanding and agreed to proceed.    History of Present Illness: Brian Maldonado is a 35 y.o. who identifies as a male who was assigned male at birth, and is being seen today for cough that started a week ago. He has taken COVID test that was negative.   HPI: Cough This is a new problem. The current episode started 1 to 4 weeks ago. The problem has been waxing and waning. The problem occurs every few minutes. The cough is Productive of sputum. Associated symptoms include chills, a fever, headaches, myalgias, nasal congestion, rhinorrhea, a sore throat and wheezing. Pertinent negatives include no ear congestion, ear pain or shortness of breath. Associated symptoms comments: Weakness, hoarse voice. He has tried rest and OTC cough suppressant for the symptoms. The treatment provided mild relief. There is no history of asthma or COPD.   Problems:  Patient Active Problem List   Diagnosis Date Noted   TMJ click 08/24/2020   Raynaud's phenomenon 08/24/2020   Attention deficit disorder 06/13/2019   FH: prostate cancer 06/12/2019   Snoring 04/19/2017   Encounter for general adult medical examination with abnormal findings 01/17/2017   Cold sore 01/17/2017   S/P dilatation of esophageal stricture 06/02/2016   Carpal tunnel syndrome 06/02/2016   Advance care planning 06/01/2016    Allergies:  Allergies  Allergen Reactions   Sulfa Antibiotics     Unknown;  Had as a child and does not recall reaction   Medications:  Current Outpatient Medications:    azithromycin (ZITHROMAX) 250 MG tablet, Take 500 mg once, then 250 mg for four days, Disp: 6 tablet, Rfl: 0   predniSONE (STERAPRED UNI-PAK 21 TAB) 10 MG (21) TBPK tablet, Use as directed, Disp: 21 tablet, Rfl: 0   amphetamine-dextroamphetamine (ADDERALL XR) 30 MG 24 hr capsule, Take 1 capsule (30 mg total) by mouth daily., Disp: 30 capsule, Rfl: 0   amphetamine-dextroamphetamine  (ADDERALL XR) 30 MG 24 hr capsule, Take 1 capsule (30 mg total) by mouth daily., Disp: 30 capsule, Rfl: 0   amphetamine-dextroamphetamine (ADDERALL XR) 30 MG 24 hr capsule, Take 1 capsule (30 mg total) by mouth daily., Disp: 30 capsule, Rfl: 0   Glutamine 500 MG TABS, Take by mouth every morning., Disp: , Rfl:    Multiple Vitamin (MULTIVITAMIN) tablet, Take 1 tablet by mouth daily., Disp: , Rfl:    omeprazole (PRILOSEC) 20 MG capsule, Take 20 mg by mouth daily as needed., Disp: , Rfl:    OVER THE COUNTER MEDICATION, Branch Chain Amino Acids - after workouts., Disp: , Rfl:    vitamin B-12 (CYANOCOBALAMIN) 1000 MCG tablet, Take 1,000 mcg by mouth daily., Disp: , Rfl:   Observations/Objective: Patient is well-developed, well-nourished in no acute distress.  Resting comfortably  at home.  Head is normocephalic, atraumatic.  No labored breathing.  Speech is clear and coherent with logical content.  Patient is alert and oriented at baseline.  Nasal congestion   Assessment and Plan: 1. Acute bacterial bronchitis - azithromycin (ZITHROMAX) 250 MG tablet; Take 500 mg once, then 250 mg for four days  Dispense: 6 tablet; Refill: 0 - predniSONE (STERAPRED UNI-PAK 21 TAB) 10 MG (21) TBPK tablet; Use as directed  Dispense: 21 tablet; Refill: 0 - Take meds as prescribed - Use a cool mist humidifier  -Use saline nose sprays frequently -Force fluids -For any cough or congestion  Use plain Mucinex- regular strength or max strength is fine -For fever or aces or pains- take tylenol or ibuprofen. -Throat lozenges if help  Follow Up Instructions: I discussed the assessment and treatment plan with the patient. The patient was provided an opportunity to ask questions and all were answered. The patient agreed with the plan and demonstrated an understanding of the instructions.  A copy of instructions were sent to the patient via MyChart unless otherwise noted below.     The patient was advised to call back  or seek an in-person evaluation if the symptoms worsen or if the condition fails to improve as anticipated.  Time:  I spent 8 minutes with the patient via telehealth technology discussing the above problems/concerns.    Jannifer Rodney, FNP

## 2021-04-06 NOTE — Patient Instructions (Signed)
Acute Bronchitis, Adult  Acute bronchitis is sudden or acute swelling of the air tubes (bronchi) in the lungs. Acute bronchitis causes these tubes to fill with mucus, whichcan make it hard to breathe. It can also cause coughing or wheezing. In adults, acute bronchitis usually goes away within 2 weeks. A cough caused by bronchitis may last up to 3 weeks. Smoking, allergies, and asthma can make thecondition worse. What are the causes? This condition can be caused by germs and by substances that irritate the lungs, including: Cold and flu viruses. The most common cause of this condition is the virus that causes the common cold. Bacteria. Substances that irritate the lungs, including: Smoke from cigarettes and other forms of tobacco. Dust and pollen. Fumes from chemical products, gases, or burned fuel. Other materials that pollute indoor or outdoor air. Close contact with someone who has acute bronchitis. What increases the risk? The following factors may make you more likely to develop this condition: A weak body's defense system, also called the immune system. A condition that affects your lungs and breathing, such as asthma. What are the signs or symptoms? Common symptoms of this condition include: Lung and breathing problems, such as: Coughing. This may bring up clear, yellow, or green mucus from your lungs (sputum). Wheezing. Having too much mucus in your lungs (chest congestion). Having shortness of breath. A fever. Chills. Aches and pains, including: Tightness in your chest and other body aches. A sore throat. How is this diagnosed? This condition is usually diagnosed based on: Your symptoms and medical history. A physical exam. You may also have other tests, including tests to rule out other conditions, such as pneumonia. These tests include: A test of lung function. Test of a mucus sample to look for the presence of bacteria. Tests to check the oxygen level in your  blood. Blood tests. Chest X-ray. How is this treated? Most cases of acute bronchitis clear up over time without treatment. Your health care provider may recommend: Drinking more fluids. This can thin your mucus, which may improve your breathing. Using a device that gets medicine into your lungs (inhaler) to help improve breathing and control coughing. Using a vaporizer or a humidifier. These are machines that add water to the air to help you breathe better. Taking a medicine for a fever. Taking a medicine that thins mucus and clears congestion (expectorant). Taking a medicine that prevents or stops coughing (cough suppressant). Follow these instructions at home: Activity Get plenty of rest. Return to your normal activities as told by your health care provider. Ask your health care provider what activities are safe for you. Lifestyle  Drink enough fluid to keep your urine pale yellow. Do not drink alcohol. Do not use any products that contain nicotine or tobacco, such as cigarettes, e-cigarettes, and chewing tobacco. If you need help quitting, ask your health care provider. Be aware that: Your bronchitis will get worse if you smoke or breathe in other people's smoke (secondhand smoke). Your lungs will heal faster if you quit smoking.  General instructions Take over-the-counter and prescription medicines only as told by your health care provider. Use an inhaler, vaporizer, or humidifier as told by your health care provider. If you have a sore throat, gargle with a salt-water mixture 3-4 times a day or as needed. To make a salt-water mixture, completely dissolve -1 tsp (3-6 g) of salt in 1 cup (237 mL) of warm water. Take two teaspoons of honey at bedtime to lessen coughing at night.   Keep all follow-up visits as told by your health care provider. This is important. How is this prevented? To lower your risk of getting this condition again: Wash your hands often with soap and water. If  soap and water are not available, use hand sanitizer. Avoid contact with people who have cold symptoms. Try not to touch your mouth, nose, or eyes with your hands. Avoid places where there are fumes from chemicals. Breathing these fumes will make your condition worse. Get the flu shot every year. Contact a health care provider if: Your symptoms do not improve after 2 weeks of treatment. You vomit more than once or twice. You have symptoms of dehydration such as: Dark urine. Dry skin or eyes. Increased thirst. Headaches. Confusion. Muscle cramps. Get help right away if you: Cough up blood. Feel pain in your chest. Have severe shortness of breath. Faint or keep feeling like you are going to faint. Have a severe headache. Have fever or chills that get worse. These symptoms may represent a serious problem that is an emergency. Do not wait to see if the symptoms will go away. Get medical help right away. Call your local emergency services (911 in the [place] Do not drive yourself to the hospital. Summary Acute bronchitis is sudden (acute) inflammation of the air tubes (bronchi) between the windpipe and the lungs. In adults, acute bronchitis usually goes away within 2 weeks, although coughing may last 3 weeks or longer. Take over-the-counter and prescription medicines only as told by your health care provider. Drink enough fluid to keep your urine pale yellow. Contact a health care provider if your symptoms do not improve after 2 weeks of treatment. Get help right away if you cough up blood, faint, or have chest pain or shortness of breath. This information is not intended to replace advice given to you by your health care provider. Make sure you discuss any questions you have with your healthcare provider. Document Revised: 05/14/2020 Document Reviewed: 01/05/2019 Elsevier Patient Education  2022 Elsevier Inc.  

## 2021-04-22 ENCOUNTER — Ambulatory Visit (INDEPENDENT_AMBULATORY_CARE_PROVIDER_SITE_OTHER): Payer: BC Managed Care – PPO | Admitting: Psychology

## 2021-04-22 DIAGNOSIS — F411 Generalized anxiety disorder: Secondary | ICD-10-CM

## 2021-04-28 ENCOUNTER — Ambulatory Visit (INDEPENDENT_AMBULATORY_CARE_PROVIDER_SITE_OTHER): Payer: BC Managed Care – PPO | Admitting: Behavioral Health

## 2021-04-28 ENCOUNTER — Other Ambulatory Visit: Payer: Self-pay

## 2021-04-28 ENCOUNTER — Encounter: Payer: Self-pay | Admitting: Behavioral Health

## 2021-04-28 VITALS — BP 140/75 | HR 93 | Ht 69.0 in | Wt 193.0 lb

## 2021-04-28 DIAGNOSIS — F902 Attention-deficit hyperactivity disorder, combined type: Secondary | ICD-10-CM | POA: Diagnosis not present

## 2021-04-28 DIAGNOSIS — F411 Generalized anxiety disorder: Secondary | ICD-10-CM

## 2021-04-28 DIAGNOSIS — F3289 Other specified depressive episodes: Secondary | ICD-10-CM

## 2021-04-28 MED ORDER — SERTRALINE HCL 50 MG PO TABS: ORAL_TABLET | ORAL | 1 refills | Status: DC

## 2021-04-28 NOTE — Progress Notes (Signed)
[facility] MD/PA/NP Initial Note  04/28/2021 4:21 PM Brian Maldonado  MRN:  [id number]  Chief Complaint:  Chief Complaint   Anxiety; ADHD; Depression; Establish Care     HPI:  35 year old male presents to this office for initial visit and to establish care. He is current patient of Dr. Dennison Bulla, Phd, for therapy. He has hx of addiction to  opioid pain medication but has been free for 5 years. He says that he is here today to see if there is any alternatives to further assist him with his lying. He says that he grew up in a family where chronic lying was constant especially by his father. He now says that he has a problem with constant, habitual lying to his wife sometime for the smallest unimportant reasons. Says that he realizes how bad it is. He lies about money, and hides his actions with just about everything. When his wife catches him in lies, he still tries to lie his way out of something. He does not understand why he does this other than he has insecurity problems and is constantly seeking affirmation. He was diagnosed with ADHD as a child but did not start taking medication until 3 years ago. Says that he lied about that also and hid it from her. He says that he has anxiety that he feels contributes to the problem as well as some mild depression. He reports his anxiety today at 7/10 and depression at 2/10. Says that he does sleep 7-8 hours every night.  He endorses irritability, lack of concentration,hyperactivity, risk behaviors and lack of impulse control. He often likes to spend money on new clothing but lies to his wife about the purchase. He denies mania but acknowledges mood swings. No psychosis, No SI, HI.   No previous psychiatric medication failures.    Visit Diagnosis:    ICD-10-CM   1. Attention deficit hyperactivity disorder (ADHD), combined type  F90.2 sertraline (ZOLOFT) 50 MG tablet    2. Generalized anxiety disorder  F41.1 sertraline (ZOLOFT) 50 MG tablet    3.  Other depression  F32.89 sertraline (ZOLOFT) 50 MG tablet      Past Psychiatric History: ADHD  Past Medical History:  Past Medical History:  Diagnosis Date   Cervicogenic headache 07/01/2015   Left side   GERD (gastroesophageal reflux disease)    Headache    Left shoulder pain    S/P dilatation of esophageal stricture    Sleep apnea 2018    Past Surgical History:  Procedure Laterality Date   ESOPHAGEAL DILATION     LIPOMA EXCISION     SHOULDER ARTHROSCOPY      Family Psychiatric History: see chart  Family History:  Family History  Problem Relation Age of Onset   Drug abuse Mother    Sleep apnea Mother    Migraines Mother    Drug abuse Father    ADD / ADHD Father    Diabetes Father    Sleep apnea Father    Prostate cancer Paternal Uncle    Prostate cancer Paternal Grandfather    Colon cancer Neg Hx     Social History:  Social History   Socioeconomic History   Marital status: Married    Spouse name: Not on file   Number of children: 1   Years of education: some coll.   Highest education level: Not on file  Occupational History   Occupation: [employer]-works from home   Occupation: Advertising copywriter:  [employer]  Tobacco Use   Smoking status: Former    Types: Cigars   Smokeless tobacco: Never  Vaping Use   Vaping Use: Never used  Substance and Sexual Activity   Alcohol use: No   Drug use: No   Sexual activity: Yes  Other Topics Concern   Not on file  Social History Narrative   Patient drinks 1 cup of coffee daily.   Patient is left handed.    Married 2012   Daughter [name] born 2017   From [place].  Working for [employer].     Enjoys playing bass guitar.     Social Determinants of Health   Financial Resource Strain: Not on file  Food Insecurity: Not on file  Transportation Needs: Not on file  Physical Activity: Not on file  Stress: Not on file  Social Connections: Not on file    Allergies:   Allergies  Allergen Reactions   Sulfa Antibiotics     Unknown;  Had as a child and does not recall reaction    Metabolic Disorder Labs: No results found for: HGBA1C, MPG No results found for: PROLACTIN Lab Results  Component Value Date   CHOL 126 08/22/2020   TRIG 115.0 08/22/2020   HDL 45.70 08/22/2020   CHOLHDL 3 08/22/2020   VLDL 23.0 08/22/2020   LDLCALC 58 08/22/2020   LDLCALC 101 (H) 06/12/2019   Lab Results  Component Value Date   TSH 1.24 08/22/2020    Therapeutic Level Labs: No results found for: LITHIUM No results found for: VALPROATE No components found for:  CBMZ  Current Medications: Current Outpatient Medications  Medication Sig Dispense Refill   amphetamine-dextroamphetamine (ADDERALL XR) 30 MG 24 hr capsule Take 1 capsule (30 mg total) by mouth daily. 30 capsule 0   Glutamine 500 MG TABS Take by mouth every morning.     Multiple Vitamin (MULTIVITAMIN) tablet Take 1 tablet by mouth daily.     sertraline (ZOLOFT) 50 MG tablet Take 1/2 tablet 25 mg daily for 7 days, then one whole tablet 50 mg daily. 30 tablet 1   vitamin B-12 (CYANOCOBALAMIN) 1000 MCG tablet Take 1,000 mcg by mouth daily.     amphetamine-dextroamphetamine (ADDERALL XR) 30 MG 24 hr capsule Take 1 capsule (30 mg total) by mouth daily. 30 capsule 0   amphetamine-dextroamphetamine (ADDERALL XR) 30 MG 24 hr capsule Take 1 capsule (30 mg total) by mouth daily. (Patient not taking: Reported on 04/28/2021) 30 capsule 0   azithromycin (ZITHROMAX) 250 MG tablet Take 500 mg once, then 250 mg for four days (Patient not taking: Reported on 04/28/2021) 6 tablet 0   omeprazole (PRILOSEC) 20 MG capsule Take 20 mg by mouth daily as needed. (Patient not taking: Reported on 04/28/2021)     OVER THE COUNTER MEDICATION Branch Chain Amino Acids - after workouts.     predniSONE (STERAPRED UNI-PAK 21 TAB) 10 MG (21) TBPK tablet Use as directed (Patient not taking: Reported on 04/28/2021) 21 tablet 0   No current  facility-administered medications for this visit.    Medication Side Effects: none  Orders placed this visit:  No orders of the defined types were placed in this encounter.   Psychiatric Specialty Exam:  Review of Systems  Blood pressure 140/75, pulse 93, height 5\' 9"  (1.753 m), weight 193 lb (87.5 kg).Body mass index is 28.5 kg/m.  General Appearance:  Neat, clean, well groomed   Eye Contact:  Good  Speech:  Clear and  Coherent and Talkative  Volume:  Increased  Mood:  Anxious, Depressed, and Worthless  Affect:  Congruent, Depressed, Restricted, Tearful, and Anxious  Thought Process:  Coherent  Orientation:  Full (Time, Place, and Person)  Thought Content: Logical   Suicidal Thoughts:  No  Homicidal Thoughts:  No  Memory:  WNL  Judgement:  Good  Insight:  Good  Psychomotor Activity:  Normal  Concentration:  Concentration: Good  Recall:  Good  Fund of Knowledge: Good  Language: Good  Assets:  Desire for Improvement  ADL's:  Intact  Cognition: WNL  Prognosis:  Good   Screenings:  GAD-7    Flowsheet Row Office Visit from 04/28/2021 in [facility]  Total GAD-7 Score 18      PHQ2-9    [facility] Office Visit from 04/28/2021 in [place] Visit from 08/22/2020 in [place] [facility] Visit from 09/19/2017 in [facility] Office Visit from 01/17/2017 in [place] [facility] Total Score 2 0 0 0  PHQ-9 Total Score 10 -- -- --       Receiving Psychotherapy: Yes  Dennison Bulla  Treatment Plan/Recommendations:  Greater than 50% of  60 min face to face time with patient was spent on counseling and coordination of care. We discussed his long history of ADHD. We discussed his marriage and relationship dynamics and explored possible reasons to why he feel compelled to lie. The lying has become so habitual that it is often instinctual to lie over the most non important reason  even when there are no serious consequences. He was candid this visit and understands how severe his lying problem is and the risk to his marriage. The circumstances have culminated with the development of mild depression but more moderate to severe anxiety. He is overly apologetic during interview and presents with apparent self esteem problems. He very unsure of his words and actions and appears to be defeated. We discussed  adult accountability and not continuing to place blame on parents for his actions. We agreed that an antidepressant might be helpful with his current anxiety and depression as well has some hair pulling he is experiencing.  He has pulled hair from beard sometimes leaving bald patch.  We agreed to:  Start Zoloft 25 mg for 7 days. Then 50 mg daily To take with a little food to help with any nausea. Will report side effects or worsening symptoms To follow up in 4 weeks to reassess Provided emergency contact information Reviewed [place], NP

## 2021-05-06 ENCOUNTER — Ambulatory Visit (INDEPENDENT_AMBULATORY_CARE_PROVIDER_SITE_OTHER): Payer: BC Managed Care – PPO | Admitting: Psychology

## 2021-05-06 DIAGNOSIS — F411 Generalized anxiety disorder: Secondary | ICD-10-CM | POA: Diagnosis not present

## 2021-05-26 ENCOUNTER — Ambulatory Visit (INDEPENDENT_AMBULATORY_CARE_PROVIDER_SITE_OTHER): Payer: BC Managed Care – PPO | Admitting: Behavioral Health

## 2021-05-26 ENCOUNTER — Encounter: Payer: Self-pay | Admitting: Behavioral Health

## 2021-05-26 ENCOUNTER — Other Ambulatory Visit: Payer: Self-pay

## 2021-05-26 DIAGNOSIS — F902 Attention-deficit hyperactivity disorder, combined type: Secondary | ICD-10-CM | POA: Diagnosis not present

## 2021-05-26 DIAGNOSIS — F3289 Other specified depressive episodes: Secondary | ICD-10-CM

## 2021-05-26 DIAGNOSIS — F411 Generalized anxiety disorder: Secondary | ICD-10-CM

## 2021-05-26 MED ORDER — SERTRALINE HCL 100 MG PO TABS
100.0000 mg | ORAL_TABLET | Freq: Every day | ORAL | 1 refills | Status: DC
Start: 2021-05-26 — End: 2021-07-20

## 2021-05-26 NOTE — Progress Notes (Signed)
[facility]  Patient ID: Brian Maldonado,  MRN: [id number]  PCP: Joaquim Nam, MD  Date of Evaluation: 05/26/2021 Time spent:30 minutes  Chief Complaint:  Chief Complaint   Follow-up; Medication Refill; Depression; Anxiety     HISTORY/CURRENT STATUS: HPI  35 year old male presents to this office for follow up and medication management. He says that he has noticed some moderate improvement with anxiety and depression. He says that he has become more aware of his behaviors such as lying or his hair pulling from beard. Says his wife has noticed improvements as well. He feels like he should try increasing his Zoloft to see if this will continue to improve. Says his anxiety today is 4/10 and depression is 2/10. He is sleeping 7-8 hours per night. No mania, no psychosis, No SI/HI.  No previous psychiatric medication failures   Individual Medical History/ Review of Systems: Changes? :No   Allergies: Sulfa antibiotics  Current Medications:  Current Outpatient Medications:    sertraline (ZOLOFT) 100 MG tablet, Take 1 tablet (100 mg total) by mouth daily., Disp: 30 tablet, Rfl: 1   amphetamine-dextroamphetamine (ADDERALL XR) 30 MG 24 hr capsule, Take 1 capsule (30 mg total) by mouth daily., Disp: 30 capsule, Rfl: 0   amphetamine-dextroamphetamine (ADDERALL XR) 30 MG 24 hr capsule, Take 1 capsule (30 mg total) by mouth daily., Disp: 30 capsule, Rfl: 0   amphetamine-dextroamphetamine (ADDERALL XR) 30 MG 24 hr capsule, Take 1 capsule (30 mg total) by mouth daily. (Patient not taking: Reported on 04/28/2021), Disp: 30 capsule, Rfl: 0   azithromycin (ZITHROMAX) 250 MG tablet, Take 500 mg once, then 250 mg for four days (Patient not taking: Reported on 04/28/2021), Disp: 6 tablet, Rfl: 0   Glutamine 500 MG TABS, Take by mouth every morning., Disp: , Rfl:    Multiple Vitamin (MULTIVITAMIN) tablet, Take 1 tablet by mouth daily., Disp: , Rfl:    omeprazole (PRILOSEC) 20 MG capsule,  Take 20 mg by mouth daily as needed. (Patient not taking: Reported on 04/28/2021), Disp: , Rfl:    OVER THE COUNTER MEDICATION, Branch Chain Amino Acids - after workouts., Disp: , Rfl:    predniSONE (STERAPRED UNI-PAK 21 TAB) 10 MG (21) TBPK tablet, Use as directed (Patient not taking: Reported on 04/28/2021), Disp: 21 tablet, Rfl: 0   vitamin B-12 (CYANOCOBALAMIN) 1000 MCG tablet, Take 1,000 mcg by mouth daily., Disp: , Rfl:  Medication Side Effects: none  Family Medical/ Social History: Changes? No  MENTAL HEALTH EXAM:  There were no vitals taken for this visit.There is no height or weight on file to calculate BMI.  General Appearance: Casual, Neat, and Well Groomed  Eye Contact:  Good  Speech:  Clear and Coherent  Volume:  Normal  Mood:  NA  Affect:  Appropriate  Thought Process:  Coherent  Orientation:  Full (Time, Place, and Person)  Thought Content: Logical   Suicidal Thoughts:  No  Homicidal Thoughts:  No  Memory:  WNL  Judgement:  Good  Insight:  Good  Psychomotor Activity:  Normal  Concentration:  Concentration: Good  Recall:  Good  Fund of Knowledge: Good  Language: Good  Assets:  Desire for Improvement  ADL's:  Intact  Cognition: WNL  Prognosis:  Good    DIAGNOSES:    ICD-10-CM   1. Attention deficit hyperactivity disorder (ADHD), combined type  F90.2 sertraline (ZOLOFT) 100 MG tablet    2. Generalized anxiety disorder  F41.1 sertraline (ZOLOFT) 100 MG tablet  3. Other depression  F32.89 sertraline (ZOLOFT) 100 MG tablet      Receiving Psychotherapy: Yes Dennison Bulla   RECOMMENDATIONS:   Greater than 50% of  30 min face to face time with patient was spent on counseling and coordination of care. We discussed his moderate improvement  with anxiety and depression. He says that he feels like his anger is more tempered. He is requesting increase in his Zoloft.    We agreed to:   To increase  Zoloft to 100 mg daily To take with a little food to help with  any nausea. Will report side effects or worsening symptoms To follow up in 4 weeks to reassess Provided emergency contact information Reviewed Gage, NP

## 2021-05-27 ENCOUNTER — Ambulatory Visit (INDEPENDENT_AMBULATORY_CARE_PROVIDER_SITE_OTHER): Payer: BC Managed Care – PPO | Admitting: Psychology

## 2021-05-27 DIAGNOSIS — F411 Generalized anxiety disorder: Secondary | ICD-10-CM

## 2021-06-09 ENCOUNTER — Other Ambulatory Visit: Payer: Self-pay | Admitting: Family Medicine

## 2021-06-09 MED ORDER — AMPHETAMINE-DEXTROAMPHET ER 30 MG PO CP24
30.0000 mg | ORAL_CAPSULE | Freq: Every day | ORAL | 0 refills | Status: DC
Start: 2021-06-09 — End: 2021-07-14

## 2021-06-09 MED ORDER — AMPHETAMINE-DEXTROAMPHET ER 30 MG PO CP24: 30.0000 mg | ORAL_CAPSULE | Freq: Every day | ORAL | 0 refills | Status: DC

## 2021-06-09 NOTE — Telephone Encounter (Signed)
Sent. Thanks.   

## 2021-06-09 NOTE — Telephone Encounter (Signed)
Refill request for amphetamine-dextroamphetamine (ADDERALL XR) 30 MG 24 hr capsule  LOV - 08/22/20 Next OV - not scheduled Last refill - 03/13/21 #30/0 x3

## 2021-06-17 ENCOUNTER — Ambulatory Visit (INDEPENDENT_AMBULATORY_CARE_PROVIDER_SITE_OTHER): Payer: BC Managed Care – PPO | Admitting: Psychology

## 2021-06-17 DIAGNOSIS — F411 Generalized anxiety disorder: Secondary | ICD-10-CM

## 2021-06-17 NOTE — Progress Notes (Signed)
[facility] Counselor/Therapist Progress Note  Patient ID: Brian Maldonado, MRN: [id number],    Date: 06/17/2021  Time Spent: 30 mins  Treatment Type: Individual Therapy  Reported Symptoms: Pt presented for a follow up session, via webex video, due to virus outbreak.  Pt granted consent for session, stating that he is in his car with no one else present.  I shared with pt that I am in my office at home with no one else here either.  Pt shares he has been "pretty good in the past 2 wks.  Things have gotten a little bit better; we have had some stress due to Brian Maldonado and Brian Maldonado being sick in the past couple of weeks."  Mental Status Exam: Appearance:  Casual     Behavior: Appropriate  Motor: Normal  Speech/Language:  Clear and Coherent  Affect: Appropriate  Mood: normal  Thought process: normal  Thought content:   WNL  Sensory/Perceptual disturbances:   WNL  Orientation: oriented to person, place, and time/date  Attention: Good  Concentration: Good  Memory: WNL  Fund of knowledge:  Good  Insight:   Good  Judgment:  Good  Impulse Control: Good   Risk Assessment: Danger to Self:  No Self-injurious Behavior: No Danger to Others: No Duty to Warn:no Physical Aggression / Violence:No  Access to Firearms a concern: No  Gang Involvement:No   Subjective: Pt shares he has been doing well for the past couple of weeks.  Work has been stressful for him to prepare to take the rest of the calendar year off.  He mentions that he would prefer to do a 30 min session today.  Pt shares that Christmas is stressful for pt because he is not likely to see his family; "I have not talked to my parents in the past several weeks since my dad refused to let me come to there home the night Brian Maldonado asked me to leave the home."  "I talked to my aunt recently and she said that my dad went to the hospital recently and I do not know why.  I am a little afraid about what might happen on Christmas  night with my dad's side of the family."  Encouraged pt to go to his family's celebration and just be prepared to leave early, if that becomes necessary.  Pt is looking forward to the holidays and he is happy with the way things are going for him.  Pt shares that Brian Laine, NP increased his dose of his medication and pt believes that this change has been beneficial for him.  Encouraged pt to continue with his self care activities and we will meet in 2 wks for a follow up session.  Interventions: Cognitive Behavioral Therapy  Diagnosis:Generalized anxiety disorder  Plan: Treatment Plan Strengths/Abilities:  Intelligent, Intuitive, Willing to participate in therapy Treatment Preferences:  Outpatient Individual Therapy Statement of Needs:  Patient is to use CBT, mindfulness and coping skills to help manage and/or decrease symptoms associated with their diagnosis. Symptoms:  Depressed/Irritable mood, worry, social withdrawal Problems Addressed:  Depressive thoughts, Sadness, Sleep issues, etc. Long Term Goals:  Pt to reduce overall level, frequency, and intensity of the feelings of depression/anxiety as evidenced by decreased irritability, negative self talk, and helpless feelings from 6 to 7 days/week to 0 to 1 days/week, per client report, for at least 3 consecutive months.  Progress: 20% Short Term Goals:  Pt to verbally express understanding of the relationship between feelings of depression/anxiety and their impact  on thinking patterns and behaviors.  Pt to verbalize an understanding of the role that distorted thinking plays in creating fears, excessive worry, and ruminations.  Progress: 20% Target Date:  06/12/2022 Frequency:  Bi-weekly Modality:  Cognitive Behavioral Therapy Interventions by Therapist:  Therapist will use CBT, Mindfulness exercises, Coping skills and Referrals, as needed by client. Client has verbally approved this treatment plan.  Karie Kirks, [facility]

## 2021-06-24 ENCOUNTER — Ambulatory Visit: Payer: BC Managed Care – PPO | Admitting: Behavioral Health

## 2021-06-27 ENCOUNTER — Other Ambulatory Visit: Payer: Self-pay | Admitting: Behavioral Health

## 2021-06-27 DIAGNOSIS — F411 Generalized anxiety disorder: Secondary | ICD-10-CM

## 2021-06-27 DIAGNOSIS — F3289 Other specified depressive episodes: Secondary | ICD-10-CM

## 2021-06-27 DIAGNOSIS — F902 Attention-deficit hyperactivity disorder, combined type: Secondary | ICD-10-CM

## 2021-07-01 ENCOUNTER — Ambulatory Visit (INDEPENDENT_AMBULATORY_CARE_PROVIDER_SITE_OTHER): Payer: BC Managed Care – PPO | Admitting: Psychology

## 2021-07-01 DIAGNOSIS — F411 Generalized anxiety disorder: Secondary | ICD-10-CM

## 2021-07-01 NOTE — Progress Notes (Signed)
[facility] Counselor/Therapist Progress Note  Patient ID: Brian Maldonado, MRN: [id number],    Date: 07/01/2021  Time Spent: 30 mins  Treatment Type: Individual Therapy  Reported Symptoms: Pt presented for a follow up session, via webex video, due to virus outbreak.  Pt granted consent for session, stating that he is in his car with no one else present.  I shared with pt that I am in my office at home with no one else here either.  Pt shares "we had a very good Christmas and New Year's holidays.  Things are going really well with me and [name] and [name] had a good holiday as well."    Mental Status Exam: Appearance:  Casual     Behavior: Appropriate  Motor: Normal  Speech/Language:  Clear and Coherent  Affect: Appropriate  Mood: normal  Thought process: normal  Thought content:   WNL  Sensory/Perceptual disturbances:   WNL  Orientation: oriented to person, place, and time/date  Attention: Good  Concentration: Good  Memory: WNL  Fund of knowledge:  Good  Insight:   Good  Judgment:  Good  Impulse Control: Good   Risk Assessment: Danger to Self:  No Self-injurious Behavior: No Danger to Others: No Duty to Warn:no Physical Aggression / Violence:No  Access to Firearms a concern: No  Gang Involvement:No   Subjective: Pt shares he has been doing well for the past couple of weeks.  Pt continues to take his medication regularly and feels it is really helping with his anxiety.  Pt shares his parents did not come to his side of the family's Christmas celebration because his dad was still in the hospital.  Pt still feels estranged from his parents and feels like he will need to be intentional about reconnecting with them.  He is ambivalent about making the call because of what he might encounter when they talk.  We talked through ways to avoid getting drawn into a difficult conversation with his dad.  Pt feels like the medicine I helping him to be more open to making the  call and pt realizes that he does not have to have a whole conversation "and get everything out on the table."  Encouraged pt to try talking with his dad and limiting the scope of the conversation to only what he wants it to include.  Pt shares he intends to try to call his dad between now and our follow up session in 2 wks.  Pt also continues to work out regularly and feels that is beneficial for him as well.  Interventions: Cognitive Behavioral Therapy  Diagnosis:Generalized anxiety disorder  Plan: Treatment Plan Strengths/Abilities:  Intelligent, Intuitive, Willing to participate in therapy Treatment Preferences:  Outpatient Individual Therapy Statement of Needs:  Patient is to use CBT, mindfulness and coping skills to help manage and/or decrease symptoms associated with their diagnosis. Symptoms:  Depressed/Irritable mood, worry, social withdrawal Problems Addressed:  Depressive thoughts, Sadness, Sleep issues, etc. Long Term Goals:  Pt to reduce overall level, frequency, and intensity of the feelings of depression/anxiety as evidenced by decreased irritability, negative self talk, and helpless feelings from 6 to 7 days/week to 0 to 1 days/week, per client report, for at least 3 consecutive months.  Progress: 20% Short Term Goals:  Pt to verbally express understanding of the relationship between feelings of depression/anxiety and their impact on thinking patterns and behaviors.  Pt to verbalize an understanding of the role that distorted thinking plays in creating fears, excessive worry, and  ruminations.  Progress: 20% Target Date:  06/12/2022 Frequency:  Bi-weekly Modality:  Cognitive Behavioral Therapy Interventions by Therapist:  Therapist will use CBT, Mindfulness exercises, Coping skills and Referrals, as needed by client. Client has verbally approved this treatment plan.  Karie Kirks, [facility]

## 2021-07-13 ENCOUNTER — Encounter: Payer: Self-pay | Admitting: Family Medicine

## 2021-07-14 ENCOUNTER — Other Ambulatory Visit: Payer: Self-pay | Admitting: Family Medicine

## 2021-07-14 MED ORDER — METHYLPHENIDATE HCL ER (LA) 30 MG PO CP24: 30.0000 mg | ORAL_CAPSULE | ORAL | 0 refills | Status: DC

## 2021-07-15 ENCOUNTER — Ambulatory Visit (INDEPENDENT_AMBULATORY_CARE_PROVIDER_SITE_OTHER): Payer: BC Managed Care – PPO | Admitting: Psychology

## 2021-07-15 DIAGNOSIS — F411 Generalized anxiety disorder: Secondary | ICD-10-CM

## 2021-07-15 NOTE — Progress Notes (Signed)
[facility] Counselor/Therapist Progress Note  Patient ID: Brian Maldonado, MRN: [id number],    Date: 07/15/2021  Time Spent: 30 mins  Treatment Type: Individual Therapy  Reported Symptoms: Pt presented for a follow up session, via webex video, due to virus outbreak.  Pt granted consent for session, stating that he is in his car with no one else present.  I shared with pt that I am in my office at home with no one else here either.  Pt shares "the last couple of weeks have been good; Meredith and I have been getting along really well recently."   Mental Status Exam: Appearance:  Casual     Behavior: Appropriate  Motor: Normal  Speech/Language:  Clear and Coherent  Affect: Appropriate  Mood: normal  Thought process: normal  Thought content:   WNL  Sensory/Perceptual disturbances:   WNL  Orientation: oriented to person, place, and time/date  Attention: Good  Concentration: Good  Memory: WNL  Fund of knowledge:  Good  Insight:   Good  Judgment:  Good  Impulse Control: Good   Risk Assessment: Danger to Self:  No Self-injurious Behavior: No Danger to Others: No Duty to Warn:no Physical Aggression / Violence:No  Access to Firearms a concern: No  Gang Involvement:No   Subjective: Pt shares that he and Meredith and Meredith's mom, stepdad, her brother and his family decided last week to leave the church they had been attending.  They did not agree with some of the financial decisions that the church was making.  This was a big decision for them to make and it is a big adjustment for all of them.  Pt also shares he has recently called his dad (a couple of days after our last session).  He had a brief conversation about his parents and his sister (cousin who he grew up with and she still lives with his parents).  He really feels good about how the Zoloft is working for him.  Pt shares that he is pleased with how well he and [name] are getting along; [name] is doing well  too.  [name] is not quite as busy with work as she has been in the past few months and this does create some stress for [place].  Pt is being supportive of her in this time.  Pt shares that [name] is so smart and is doing well in school; she is also taking piano lessons, doing gymnastics and dance, etc.  [name] is having some behavior issues (mostly anger related) but nothing out of the ordinary.  Pt and [name] are working together to make sure [name] is watching and listening to the right things as she matures.  Pt and [name] are working toward having a second child; they did have trouble getting pregnant with [name] so pt is not sure when it will happen.  Pt shares that while they both would prefer a second child, they do not want to stress about it the way they did with their initial pregnancy.  Encouraged pt to continue with his self care activities and we will meet in 2 wks for a follow up session.    Interventions: Cognitive Behavioral Therapy  Diagnosis:Generalized anxiety disorder  Plan: Treatment Plan Strengths/Abilities:  Intelligent, Intuitive, Willing to participate in therapy Treatment Preferences:  Outpatient Individual Therapy Statement of Needs:  Patient is to use CBT, mindfulness and coping skills to help manage and/or decrease symptoms associated with their diagnosis. Symptoms:  Depressed/Irritable mood, worry, social withdrawal Problems Addressed:  Depressive thoughts, Sadness, Sleep issues, etc. Long Term Goals:  Pt to reduce overall level, frequency, and intensity of the feelings of depression/anxiety as evidenced by decreased irritability, negative self talk, and helpless feelings from 6 to 7 days/week to 0 to 1 days/week, per client report, for at least 3 consecutive months.  Progress: 20% Short Term Goals:  Pt to verbally express understanding of the relationship between feelings of depression/anxiety and their impact on thinking patterns and behaviors.  Pt to verbalize an  understanding of the role that distorted thinking plays in creating fears, excessive worry, and ruminations.  Progress: 20% Target Date:  06/12/2022 Frequency:  Bi-weekly Modality:  Cognitive Behavioral Therapy Interventions by Therapist:  Therapist will use CBT, Mindfulness exercises, Coping skills and Referrals, as needed by client. Client has verbally approved this treatment plan.  Karie Kirks, [facility]

## 2021-07-20 ENCOUNTER — Other Ambulatory Visit: Payer: Self-pay

## 2021-07-20 ENCOUNTER — Encounter: Payer: Self-pay | Admitting: Behavioral Health

## 2021-07-20 ENCOUNTER — Ambulatory Visit (INDEPENDENT_AMBULATORY_CARE_PROVIDER_SITE_OTHER): Payer: BC Managed Care – PPO | Admitting: Behavioral Health

## 2021-07-20 DIAGNOSIS — F3289 Other specified depressive episodes: Secondary | ICD-10-CM | POA: Diagnosis not present

## 2021-07-20 DIAGNOSIS — F902 Attention-deficit hyperactivity disorder, combined type: Secondary | ICD-10-CM

## 2021-07-20 DIAGNOSIS — F411 Generalized anxiety disorder: Secondary | ICD-10-CM | POA: Diagnosis not present

## 2021-07-20 MED ORDER — SERTRALINE HCL 100 MG PO TABS: 100.0000 mg | ORAL_TABLET | Freq: Every day | ORAL | 3 refills | Status: DC

## 2021-07-20 NOTE — Progress Notes (Signed)
[facility]  Patient ID: Brian Maldonado,  MRN: [id number]  PCP: Tonia Ghent, MD  Date of Evaluation: 07/20/2021 Time spent:30 minutes  Chief Complaint:   HISTORY/CURRENT STATUS: HPI   36 year old male presents to this office for follow up and medication management. He says that he has noticed significant improvement with anxiety and depression since last visit.  He says that he has become more aware of his behaviors such as lying or his hair pulling from beard, but says it may have decreased some.  Says his wife has noticed improvements as well. He feels like he should try increasing his Zoloft to see if this will continue to improve. Says his anxiety today is 4/10 and depression is 2/10. He is sleeping 7-8 hours per night. No mania, no psychosis, No SI/HI.   No previous psychiatric medication failures   Individual Medical History/ Review of Systems: Changes? :No   Allergies: Sulfa antibiotics  Current Medications:  Current Outpatient Medications:    methylphenidate (RITALIN LA) 30 MG 24 hr capsule, Take 1 capsule (30 mg total) by mouth every morning. Substitute for Adderall XR 30 mg, Disp: 30 capsule, Rfl: 0   amphetamine-dextroamphetamine (ADDERALL XR) 30 MG 24 hr capsule, Take 1 capsule (30 mg total) by mouth daily., Disp: 30 capsule, Rfl: 0   azithromycin (ZITHROMAX) 250 MG tablet, Take 500 mg once, then 250 mg for four days (Patient not taking: Reported on 04/28/2021), Disp: 6 tablet, Rfl: 0   Glutamine 500 MG TABS, Take by mouth every morning., Disp: , Rfl:    Multiple Vitamin (MULTIVITAMIN) tablet, Take 1 tablet by mouth daily., Disp: , Rfl:    omeprazole (PRILOSEC) 20 MG capsule, Take 20 mg by mouth daily as needed. (Patient not taking: Reported on 04/28/2021), Disp: , Rfl:    OVER THE COUNTER MEDICATION, Branch Chain Amino Acids - after workouts., Disp: , Rfl:    predniSONE (STERAPRED UNI-PAK 21 TAB) 10 MG (21) TBPK tablet, Use as directed (Patient not  taking: Reported on 04/28/2021), Disp: 21 tablet, Rfl: 0   sertraline (ZOLOFT) 100 MG tablet, Take 1 tablet (100 mg total) by mouth daily., Disp: 30 tablet, Rfl: 1   vitamin B-12 (CYANOCOBALAMIN) 1000 MCG tablet, Take 1,000 mcg by mouth daily., Disp: , Rfl:  Medication Side Effects: none  Family Medical/ Social History: Changes? No  MENTAL HEALTH EXAM:  There were no vitals taken for this visit.There is no height or weight on file to calculate BMI.  General Appearance: Casual, Neat, and Well Groomed  Eye Contact:  Good  Speech:  Clear and Coherent  Volume:  Normal  Mood:  NA  Affect:  Appropriate  Thought Process:  Coherent  Orientation:  Full (Time, Place, and Person)  Thought Content: Logical   Suicidal Thoughts:  No  Homicidal Thoughts:  No  Memory:  WNL  Judgement:  Good  Insight:  Good  Psychomotor Activity:  Normal  Concentration:  Concentration: Good  Recall:  Good  Fund of Knowledge: Good  Language: Good  Assets:  Desire for Improvement  ADL's:  Intact  Cognition: WNL  Prognosis:  Good    DIAGNOSES:    ICD-10-CM   1. Generalized anxiety disorder  F41.1     2. Attention deficit hyperactivity disorder (ADHD), combined type  F90.2     3. Other depression  F32.89       Receiving Psychotherapy: Yes Dennison Bulla   RECOMMENDATIONS:    Greater than 50% of  30  min face to face time with patient was spent on counseling and coordination of care. We discussed his significant improvement  with anxiety and depression. He says that he feels like his anger is more tempered. He is requesting increase in his Zoloft.    We agreed to:  To Continue Zoloft to 100 mg daily To initiate NAC 600 mg daily for 2 weeks, then 1600 mg capsule twice daily for two weeks. Monitor for results, if minimal, add 3rd capsule for total 4600 daily until next visit.  To take with a little food to help with any nausea. Will report side effects or worsening symptoms To follow up in 4 weeks to  reassess Provided emergency contact information Reviewed Ailey, NP

## 2021-07-29 ENCOUNTER — Ambulatory Visit (INDEPENDENT_AMBULATORY_CARE_PROVIDER_SITE_OTHER): Payer: BC Managed Care – PPO | Admitting: Psychology

## 2021-07-29 DIAGNOSIS — F411 Generalized anxiety disorder: Secondary | ICD-10-CM

## 2021-07-29 NOTE — Progress Notes (Signed)
[facility] Counselor/Therapist Progress Note  Patient ID: Brian Maldonado, MRN: [id number],    Date: 07/29/2021  Time Spent: 30 mins  Treatment Type: Individual Therapy  Reported Symptoms: Pt presented for a follow up session, via webex video, due to virus outbreak.  Pt granted consent for session, stating that he is in his car with no one else present.  I shared with pt that I am in my office at home with no one else here either.  Pt shares "the last couple of weeks have been busy; work is stressful but normal.  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  This is a lot of stress for her and she does not handle stress well."   Mental Status Exam: Appearance:  Casual     Behavior: Appropriate  Motor: Normal  Speech/Language:  Clear and Coherent  Affect: Appropriate  Mood: normal  Thought process: normal  Thought content:   WNL  Sensory/Perceptual disturbances:   WNL  Orientation: oriented to person, place, and time/date  Attention: Good  Concentration: Good  Memory: WNL  Fund of knowledge:  Good  Insight:   Good  Judgment:  Good  Impulse Control: Good   Risk Assessment: Danger to Self:  No Self-injurious Behavior: No Danger to Others: No Duty to Warn:no Physical Aggression / Violence:No  Access to Firearms a concern: No  Gang Involvement:No   Subjective: Pt shares that Brian Maldonado is starting to get really anxious and it is hard for pt to watch and not be able to help her out with.  Pt shares that "things with me and Brian Maldonado have been really great between [place]."  Pt shares that Brian Maldonado is doing well in school; she was chosen to be the kindergarten student to present the Homecoming Queen's crown to her at [employer] last week.  Pt shares that he saw Brian Maldonado last week for a medication check up and things are going well for pt. Pt continues to take his Zoloft and feels it is beneficial  for him.  Pt continues to work out regularly (about an hour and a half each day); he has changed gyms again, this time to a 24 hr location.  He feels good about the change.  He also enjoys spending time with Brian Maldonado and Brian Maldonado.  Pt requests a 30 min session today, due to work responsibilities.  He also requests that we meet next in 4 wks, rather than the customary 2 wk interval.  We will see how this works for pt.  Encouraged pt to continue with his self care activities and we will meet in 4 wks for a follow up session.  Advised pt he can call the office for an earlier appt, if needed.   Interventions: Cognitive Behavioral Therapy  Diagnosis:Generalized anxiety disorder  Plan: Treatment Plan Strengths/Abilities:  Intelligent, Intuitive, Willing to participate in therapy Treatment Preferences:  Outpatient Individual Therapy Statement of Needs:  Patient is to use CBT, mindfulness and coping skills to help manage and/or decrease symptoms associated with their diagnosis. Symptoms:  Depressed/Irritable mood, worry, social withdrawal Problems Addressed:  Depressive thoughts, Sadness, Sleep issues, etc. Long Term Goals:  Pt to reduce overall level, frequency, and intensity of the feelings of depression/anxiety as evidenced by decreased irritability, negative self talk, and helpless feelings from 6 to 7 days/week to 0 to 1 days/week, per client report, for at least 3 consecutive months.  Progress: 20%  Short Term Goals:  Pt to verbally express understanding of the relationship between feelings of depression/anxiety and their impact on thinking patterns and behaviors.  Pt to verbalize an understanding of the role that distorted thinking plays in creating fears, excessive worry, and ruminations.  Progress: 20% Target Date:  06/12/2022 Frequency:  Bi-weekly Modality:  Cognitive Behavioral Therapy Interventions by Therapist:  Therapist will use CBT, Mindfulness exercises, Coping skills and Referrals, as needed  by client. Client has verbally approved this treatment plan.  Karie Kirks, [facility]               Karie Kirks, [facility]

## 2021-08-14 ENCOUNTER — Other Ambulatory Visit: Payer: Self-pay | Admitting: Family Medicine

## 2021-08-14 MED ORDER — AMPHETAMINE-DEXTROAMPHETAMINE 15 MG PO TABS: 15.0000 mg | ORAL_TABLET | Freq: Two times a day (BID) | ORAL | 0 refills | Status: DC

## 2021-08-14 NOTE — Telephone Encounter (Signed)
Patient called regarding his refill.  Since the extended release is on backorder with no known fill date, patient is requesting the normal non extended release since it is in stock   Uses        [facility] - [place], [place] - [address] Phone:  [phone]  Fax:  [phone]    Patient will be out of medicine on Monday 2.20.23

## 2021-08-26 ENCOUNTER — Ambulatory Visit (INDEPENDENT_AMBULATORY_CARE_PROVIDER_SITE_OTHER): Payer: BC Managed Care – PPO | Admitting: Psychology

## 2021-08-26 DIAGNOSIS — F411 Generalized anxiety disorder: Secondary | ICD-10-CM | POA: Diagnosis not present

## 2021-08-26 NOTE — Progress Notes (Addendum)
[facility] Counselor/Therapist Progress Note ? ?Patient ID: Brian Maldonado, MRN: [id number],   ? ?Date: 08/26/2021 ? ?Time Spent: 45 mins ? ?Treatment Type: Individual Therapy ? ?Reported Symptoms: Pt presented for a follow up session, via webex video, due to virus outbreak.  Pt granted consent for session, stating that he is in his car with no one else present.  I shared with pt that I am in my office at home with no one else here either.  Pt shares "work has been busy over the past month.  We were working to close out the Brian Maldonado emergency (Brian Maldonado) and then the state came back and said they will just give [place] 2 more months to continue it."  ? ?Mental Status Exam: ?Appearance:  Casual     ?Behavior: Appropriate  ?Motor: Normal  ?Speech/Language:  Clear and Coherent  ?Affect: Appropriate  ?Mood: normal  ?Thought process: normal  ?Thought content:   WNL  ?Sensory/Perceptual disturbances:   WNL  ?Orientation: oriented to person, place, and time/date  ?Attention: Good  ?Concentration: Good  ?Memory: WNL  ?Fund of knowledge:  Good  ?Insight:   Good  ?Judgment:  Good  ?Impulse Control: Good  ? ?Risk Assessment: ?Danger to Self:  No ?Self-injurious Behavior: No ?Danger to Others: No ?Duty to Warn:no ?Physical Aggression / Violence:No  ?Access to Firearms a concern: No  ?Gang Involvement:No  ? ?Subjective: Pt shares that "my boss came to me and said we can take off work because of the extension from the state, so I took yesterday off."  Pt shares that he was thinking about applying for a new job but pt decided against it and felt that was the best decision.  Pt shares that Brian Maldonado have been fine since our last session.  Pt continues to take Adderall XR and Zoloft; Adderall is in short supply and he has had to use a couple of other medications in the past couple of months; he continues to see Brian Maldonado at Brian Maldonado and that is going well for pt.  Pt shares that he has been getting along  well with Brian Maldonado; he continues to be honest with her and she appreciates that.  They continue to try to have another baby and that is stressful for them given the difficulty they had when getting pregnant with Brian Maldonado.  January was a tight month for them financially because there was not a lot of activity in the real estate market.  Pt continues to have a good relationship with Brian Maldonado.  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  Pt continues to work out daily and he is happy with the results he is seeing.  Pt shares that Brian Maldonado is doing well in school; she fractured her arm in her Acrobatics class but, thankfully, she only has to wear a splint for another couple of weeks.  Pt shares that he has been sleeping pretty well; still uses his CPAP machine regularly.  Wakes up pretty energetic most days.  Encouraged pt to continue with his self care activities and we will meet in 4 wks for a follow up session.   ? ?Interventions: Cognitive Behavioral Therapy ? ?Diagnosis:Generalized anxiety disorder ? ?Plan: Treatment Plan ?Strengths/Abilities:  Intelligent, Intuitive, Willing to participate in therapy ?Treatment Preferences:  Outpatient Individual Therapy ?Statement of Needs:  Patient is to use CBT, mindfulness and coping skills to help  manage and/or decrease symptoms associated with their diagnosis. ?Symptoms:  Depressed/Irritable mood, worry, social withdrawal ?Problems Addressed:  Depressive thoughts, Sadness, Sleep issues, etc. ?Long Term Goals:  Pt to reduce overall level, frequency, and intensity of the feelings of depression/anxiety as evidenced by decreased irritability, negative self talk, and helpless feelings from 6 to 7 days/week to 0 to 1 days/week, per client report, for at least 3 consecutive months.  Progress: 20% ?Short Term Goals:  Pt to verbally express understanding of the relationship between feelings of  depression/anxiety and their impact on thinking patterns and behaviors.  Pt to verbalize an understanding of the role that distorted thinking plays in creating fears, excessive worry, and ruminations.  Progress: 20% ?Target Date:  06/12/2022 ?Frequency:  Bi-weekly ?Modality:  Cognitive Behavioral Therapy ?Interventions by Therapist:  Therapist will use CBT, Mindfulness exercises, Coping skills and Referrals, as needed by client. ?Client has verbally approved this treatment plan. ? ?Brian Maldonado, [facility] ?

## 2021-09-14 ENCOUNTER — Ambulatory Visit (INDEPENDENT_AMBULATORY_CARE_PROVIDER_SITE_OTHER): Payer: BC Managed Care – PPO | Admitting: Behavioral Health

## 2021-09-14 ENCOUNTER — Encounter: Payer: Self-pay | Admitting: Behavioral Health

## 2021-09-14 ENCOUNTER — Other Ambulatory Visit: Payer: Self-pay

## 2021-09-14 DIAGNOSIS — F411 Generalized anxiety disorder: Secondary | ICD-10-CM | POA: Diagnosis not present

## 2021-09-14 DIAGNOSIS — F902 Attention-deficit hyperactivity disorder, combined type: Secondary | ICD-10-CM

## 2021-09-14 DIAGNOSIS — F3289 Other specified depressive episodes: Secondary | ICD-10-CM | POA: Diagnosis not present

## 2021-09-14 MED ORDER — SERTRALINE HCL 100 MG PO TABS: 100.0000 mg | ORAL_TABLET | Freq: Every day | ORAL | 4 refills | Status: DC

## 2021-09-14 NOTE — Progress Notes (Signed)
[facility] Check ? ?Patient ID: Brian Maldonado,  ?MRN: [id number] ? ?PCP: Joaquim Nam, MD ? ?Date of Evaluation: 09/14/2021 ?Time spent:30 minutes ? ?Chief Complaint:  ?Chief Complaint   ?Anxiety; Depression; Follow-up; Medication Refill ?  ? ? ?HISTORY/CURRENT STATUS: ?HPI ? ?36 year old male presents to this office for follow up and medication management. He says that he has noticed significant improvement with anxiety and depression since last visit.  His hair pulling has improved to almost zero. No more bald patches in beard. Says his wife has noticed improvements as well and their relationship has turned around 360 degrees. They are now trying to conceive a second child. He does not want to change his medications at this time. Says his anxiety today is 2/10 and depression is 0/10. He is sleeping 7-8 hours per night. No mania, no psychosis, No SI/HI. ?  ?No previous psychiatric medication failures ? ?Individual Medical History/ Review of Systems: Changes? :No  ? ?Allergies: Sulfa antibiotics ? ?Current Medications:  ?Current Outpatient Medications:  ?  amphetamine-dextroamphetamine (ADDERALL) 15 MG tablet, Take 1 tablet by mouth 2 (two) times daily., Disp: 60 tablet, Rfl: 0 ?  Glutamine 500 MG TABS, Take by mouth every morning., Disp: , Rfl:  ?  Multiple Vitamin (MULTIVITAMIN) tablet, Take 1 tablet by mouth daily., Disp: , Rfl:  ?  vitamin B-12 (CYANOCOBALAMIN) 1000 MCG tablet, Take 1,000 mcg by mouth daily., Disp: , Rfl:  ?  azithromycin (ZITHROMAX) 250 MG tablet, Take 500 mg once, then 250 mg for four days (Patient not taking: Reported on 04/28/2021), Disp: 6 tablet, Rfl: 0 ?  omeprazole (PRILOSEC) 20 MG capsule, Take 20 mg by mouth daily as needed. (Patient not taking: Reported on 04/28/2021), Disp: , Rfl:  ?  OVER THE COUNTER MEDICATION, [place] Amino Acids - after workouts., Disp: , Rfl:  ?  predniSONE (STERAPRED UNI-PAK 21 TAB) 10 MG (21) TBPK tablet, Use as directed (Patient not taking:  Reported on 04/28/2021), Disp: 21 tablet, Rfl: 0 ?  sertraline (ZOLOFT) 100 MG tablet, Take 1 tablet (100 mg total) by mouth daily., Disp: 30 tablet, Rfl: 4 ?Medication Side Effects: none ? ?Family Medical/ Social History: Changes? No ? ?MENTAL HEALTH EXAM: ? ?There were no vitals taken for this visit.There is no height or weight on file to calculate BMI.  ?General Appearance: Casual and Neat  ?Eye Contact:  Good  ?Speech:  Clear and Coherent  ?Volume:  Normal  ?Mood:  NA  ?Affect:  Appropriate  ?Thought Process:  Coherent  ?Orientation:  Full (Time, Place, and Person)  ?Thought Content: Logical   ?Suicidal Thoughts:  No  ?Homicidal Thoughts:  No  ?Memory:  WNL  ?Judgement:  Good  ?Insight:  Good  ?Psychomotor Activity:  Normal  ?Concentration:  Concentration: Good  ?Recall:  Good  ?Fund of Knowledge: Good  ?Language: Good  ?Assets:  Desire for Improvement  ?ADL's:  Intact  ?Cognition: WNL  ?Prognosis:  Good  ? ? ?DIAGNOSES:  ?  ICD-10-CM   ?1. Generalized anxiety disorder  F41.1 sertraline (ZOLOFT) 100 MG tablet  ?  ?2. Attention deficit hyperactivity disorder (ADHD), combined type  F90.2 sertraline (ZOLOFT) 100 MG tablet  ?  ?3. Other depression  F32.89 sertraline (ZOLOFT) 100 MG tablet  ?  ? ? ?Receiving Psychotherapy: Yes  ? ? ?RECOMMENDATIONS:  ? ?Greater than 50% of  30 min face to face time with patient was spent on counseling and coordination of care. We discussed his significant  improvement  with anxiety and depression. He is very pleased with his progress and medication. Reports a 80% improvement with hair pulling. He is no longer causing bald patches to appear in beard.  He does not want to adjust medications at this time. He is continuing in psychotherapy monthly.  ?  ?We agreed to: ?  ?To Continue Zoloft to 100 mg daily ?Continue NAC 3600 mg daily ?To take with a little food to help with any nausea. ?Will report side effects or worsening symptoms ?To follow up in 4 months to reassess per pt. ?Provided  emergency contact information ?Reviewed PDMP ?  ? ? ? ? ? ?Joan Flores, NP  ?

## 2021-09-23 ENCOUNTER — Ambulatory Visit (INDEPENDENT_AMBULATORY_CARE_PROVIDER_SITE_OTHER): Payer: BC Managed Care – PPO | Admitting: Psychology

## 2021-09-23 DIAGNOSIS — F411 Generalized anxiety disorder: Secondary | ICD-10-CM | POA: Diagnosis not present

## 2021-09-23 NOTE — Progress Notes (Signed)
[facility] Counselor/Therapist Progress Note ? ?Patient ID: Brian Maldonado, MRN: [id number],   ? ?Date: 09/23/2021 ? ?Time Spent: 45 mins ? ?Treatment Type: Individual Therapy ? ?Reported Symptoms: Pt presented for a follow up session, via webex video, due to virus outbreak.  Pt granted consent for session, stating that he is in his car with no one else present.  I shared with pt that I am in my office at home with no one else here either.  Pt shares "a lot of stuff going on at work, but nothing to complain about.  They are trying to automate a lot of things that we are not used to."  ? ?Mental Status Exam: ?Appearance:  Casual     ?Behavior: Appropriate  ?Motor: Normal  ?Speech/Language:  Clear and Coherent  ?Affect: Appropriate  ?Mood: normal  ?Thought process: normal  ?Thought content:   WNL  ?Sensory/Perceptual disturbances:   WNL  ?Orientation: oriented to person, place, and time/date  ?Attention: Good  ?Concentration: Good  ?Memory: WNL  ?Fund of knowledge:  Good  ?Insight:   Good  ?Judgment:  Good  ?Impulse Control: Good  ? ?Risk Assessment: ?Danger to Self:  No ?Self-injurious Behavior: No ?Danger to Others: No ?Duty to Warn:no ?Physical Aggression / Violence:No  ?Access to Firearms a concern: No  ?Gang Involvement:No  ? ?Subjective: Pt shares that "we have been having a lot of meetings lately and we are going to be able to work from home at least one day per week.  There is a little bit of stress at work, but it is OK."  Pt shares that he and [name] and doing well; they are implementing a new budget for the home.  Pt shares that [name] made $256K last year and $215K the year before that.  They are not sure where all of their money is going so the new budget is designed to help them be more careful with their money.  Pt, [name], and her mom Malachy Mood) had a conversation about how to manage their money effectively and they asked pt to be in charge of managing the budget.  They want to get  their home paid off in the next year.  They owe about $100K on it now.  [name] was getting upset at all of the money pt had spent in the past and for hiding it from her.  Malachy Mood was advocating for pt with [name] and pt appreciated that support.  [name] has to set aside 30% of all of the money she makes to pay state and federal income taxes and she has to pay her own social security as well.  She has been putting some money into an [place] for retirement but has not been saving in a 401K.  Pt feels good about their conversation about money and the budget. "There will be some growing pains with this new budget process but I think it will be a good thing."  Pt shares that he continues to work out pretty regularly; "I took last week off as a 'rest week' because I was feeling run down."  Pt shares he is now scheduling rest days on Wed and Sun each week.  Pt shares that [name] continues to grow up quickly.  Pt continues to take his Adderall XR (short supply and has changed from time to time) and Zoloft daily; Lesle Chris suggested pt adding a supplement (NAC) that has helped "boost" the effect of the Zoloft for pt.  Pt shares that  [name] is doing well in kindergarten.  He is concerned about her lack of attention span right now but knows she is only 36 yo.  He is helping her with her piano lessons; she gets frustrated but he is trying to be supportive of her to stick with it.  They continue to visit churches and hope to find a good one to stay with.  He continues to talk with his parents from time to time and feels OK about that.  Encouraged pt to continue with his self care activities and we will meet in 4 wks for a follow up session.   ? ?Interventions: Cognitive Behavioral Therapy ? ?Diagnosis:Generalized anxiety disorder ? ?Plan: Treatment Plan ?Strengths/Abilities:  Intelligent, Intuitive, Willing to participate in therapy ?Treatment Preferences:  Outpatient Individual Therapy ?Statement of Needs:  Patient is to use  CBT, mindfulness and coping skills to help manage and/or decrease symptoms associated with their diagnosis. ?Symptoms:  Depressed/Irritable mood, worry, social withdrawal ?Problems Addressed:  Depressive thoughts, Sadness, Sleep issues, etc. ?Long Term Goals:  Pt to reduce overall level, frequency, and intensity of the feelings of depression/anxiety as evidenced by decreased irritability, negative self talk, and helpless feelings from 6 to 7 days/week to 0 to 1 days/week, per client report, for at least 3 consecutive months.  Progress: 20% ?Short Term Goals:  Pt to verbally express understanding of the relationship between feelings of depression/anxiety and their impact on thinking patterns and behaviors.  Pt to verbalize an understanding of the role that distorted thinking plays in creating fears, excessive worry, and ruminations.  Progress: 20% ?Target Date:  06/12/2022 ?Frequency:  Bi-weekly ?Modality:  Cognitive Behavioral Therapy ?Interventions by Therapist:  Therapist will use CBT, Mindfulness exercises, Coping skills and Referrals, as needed by client. ?Client has verbally approved this treatment plan. ? ?Ivan Anchors, [facility] ?

## 2021-10-12 ENCOUNTER — Other Ambulatory Visit: Payer: Self-pay | Admitting: Family Medicine

## 2021-10-12 NOTE — Telephone Encounter (Signed)
Refill request Adderall ?Last refill 08/14/21 #60 ?Last office visit 08/22/20 ?No upcoming appointment scheduled ?

## 2021-10-13 MED ORDER — AMPHETAMINE-DEXTROAMPHETAMINE 15 MG PO TABS
15.0000 mg | ORAL_TABLET | Freq: Two times a day (BID) | ORAL | 0 refills | Status: DC
Start: 2021-10-13 — End: 2021-11-08

## 2021-10-13 NOTE — Telephone Encounter (Signed)
Sent. Thanks.   

## 2021-10-21 ENCOUNTER — Ambulatory Visit (INDEPENDENT_AMBULATORY_CARE_PROVIDER_SITE_OTHER): Payer: BC Managed Care – PPO | Admitting: Psychology

## 2021-10-21 DIAGNOSIS — F411 Generalized anxiety disorder: Secondary | ICD-10-CM | POA: Diagnosis not present

## 2021-10-21 NOTE — Progress Notes (Signed)
[place] Counselor/Therapist Progress Note ? ?Patient ID: Brian Maldonado, MRN: [id number],   ? ?Date: 10/21/2021 ? ?Time Spent: 45 mins ? ?Treatment Type: Individual Therapy ? ?Reported Symptoms: Pt presented for a follow up session, via webex video, due to virus outbreak.  Pt granted consent for session, stating that he is in his home with no one else present.  I shared with pt that I am in my office at home with no one else here either.  Pt shares "I am working from home on Wednesdays, starting last week.  It has been really good.  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 ?Mental Status Exam: ?Appearance:  Casual     ?Behavior: Appropriate  ?Motor: Normal  ?Speech/Language:  Clear and Coherent  ?Affect: Appropriate  ?Mood: normal  ?Thought process: normal  ?Thought content:   WNL  ?Sensory/Perceptual disturbances:   WNL  ?Orientation: oriented to person, place, and time/date  ?Attention: Good  ?Concentration: Good  ?Memory: WNL  ?Fund of knowledge:  Good  ?Insight:   Good  ?Judgment:  Good  ?Impulse Control: Good  ? ?Risk Assessment: ?Danger to Self:  No ?Self-injurious Behavior: No ?Danger to Others: No ?Duty to Warn:no ?Physical Aggression / Violence:No  ?Access to Firearms a concern: No  ?Gang Involvement:No  ? ?Subjective: Pt shares that "Things have been so-so since our last session.  There have been some times where [name] has been really frustrated with me.  Her family has told her that we argue too much in front of [name] and they said we should not do that."  Pt shares that [name] has been frustrated with pt; "she has said that it would be so much easier if you weren't here because you are always messing up."  This hurt pt's feelings; he understands why, based on his previous mistakes, she gets frustrated with him.  He believes he has gotten much better about not making as many mistakes as in the past.  Pt shares that Meredith's mom Malachy Mood) has continued to be  supportive of pt.  Pt shares that he and [name] are still trying to have a baby but she is not pregnant yet.  Pt shares they did get their taxes done and submitted.  Pt shares that they believe they have settled on a new church they feel good about.  Pt shares that he continues to take his medication regularly with no issues to report.  Pt shares that work is going pretty well and he is easily able to keep up with his assignments.  They are working well on their new budget; "it has been eye opening for [name] that she has been spending as much as she has been.  It has been helpful for both of [place]."  Encouraged pt to continue with his self care activities and we will meet in 4 wks for a follow up session.   ? ?Interventions: Cognitive Behavioral Therapy ? ?Diagnosis:Generalized anxiety disorder ? ?Plan: Treatment Plan ?Strengths/Abilities:  Intelligent, Intuitive, Willing to participate in therapy ?Treatment Preferences:  Outpatient Individual Therapy ?Statement of Needs:  Patient is to use CBT, mindfulness and coping skills to help manage and/or decrease symptoms associated with their diagnosis. ?Symptoms:  Depressed/Irritable mood, worry, social withdrawal ?Problems Addressed:  Depressive thoughts, Sadness, Sleep issues, etc. ?Long Term Goals:  Pt to reduce overall level, frequency, and intensity of the feelings of depression/anxiety as evidenced by decreased irritability, negative self talk, and helpless feelings  from 6 to 7 days/week to 0 to 1 days/week, per client report, for at least 3 consecutive months.  Progress: 20% ?Short Term Goals:  Pt to verbally express understanding of the relationship between feelings of depression/anxiety and their impact on thinking patterns and behaviors.  Pt to verbalize an understanding of the role that distorted thinking plays in creating fears, excessive worry, and ruminations.  Progress: 20% ?Target Date:  06/12/2022 ?Frequency:  Bi-weekly ?Modality:  Cognitive Behavioral  Therapy ?Interventions by Therapist:  Therapist will use CBT, Mindfulness exercises, Coping skills and Referrals, as needed by client. ?Client has verbally approved this treatment plan. ? ?Ivan Anchors, [facility] ?

## 2021-11-08 ENCOUNTER — Other Ambulatory Visit: Payer: Self-pay | Admitting: Family Medicine

## 2021-11-09 NOTE — Telephone Encounter (Signed)
Refill request for amphetamine-dextroamphetamine (ADDERALL) 15 MG tablet ? ?LOV - 08/22/20 ?Next OV - not scheduled ?Last refill - 10/13/21 #60/0 ? ?

## 2021-11-10 MED ORDER — AMPHETAMINE-DEXTROAMPHETAMINE 15 MG PO TABS: 15.0000 mg | ORAL_TABLET | Freq: Two times a day (BID) | ORAL | 0 refills | Status: DC

## 2021-11-19 ENCOUNTER — Ambulatory Visit (INDEPENDENT_AMBULATORY_CARE_PROVIDER_SITE_OTHER): Payer: BC Managed Care – PPO | Admitting: Psychology

## 2021-11-19 DIAGNOSIS — F411 Generalized anxiety disorder: Secondary | ICD-10-CM | POA: Diagnosis not present

## 2021-11-19 NOTE — Progress Notes (Signed)
[facility] Counselor/Therapist Progress Note  Patient ID: Brian Maldonado, MRN: [id number],    Date: 11/19/2021  Time Spent: 45 mins  Treatment Type: Individual Therapy  Reported Symptoms: Pt presented for a follow up session, via webex video.  Pt granted consent for session, stating that he is in his car with no one else present.  I shared with pt that I am in my office at home with no one else here either.  Pt shares "I have been OK; if I had talked to you Monday, it would have been a different story."   Mental Status Exam: Appearance:  Casual     Behavior: Appropriate  Motor: Normal  Speech/Language:  Clear and Coherent  Affect: Appropriate  Mood: normal  Thought process: normal  Thought content:   WNL  Sensory/Perceptual disturbances:   WNL  Orientation: oriented to person, place, and time/date  Attention: Good  Concentration: Good  Memory: WNL  Fund of knowledge:  Good  Insight:   Good  Judgment:  Good  Impulse Control: Good   Risk Assessment: Danger to Self:  No Self-injurious Behavior: No Danger to Others: No Duty to Warn:no Physical Aggression / Violence:No  Access to Firearms a concern: No  Gang Involvement:No   Subjective: Pt shares that "I do not have anyone to talk to, except for you, and I have to pay you to talk to me."  Pt shares he feels isolated a lot of times.  He can talk to Meade, Meredith's mom, but she will support [name] as well.  He does say that "Me and [name] had a great conversation on Sunday" because [name] had been getting frustrated and mad at pt because "I was making mistakes."  Pt shares that [name] "is looking for me to be perfect."  "Monday, we had a big fight and [name] told me I could leave and that I would not get custody of [name]."  Pt seems to have felt threatened by the argument.  Pt shares that he is trying every day to "do things the way [name] wants them to be."  Pt is frustrated with himself because it  seems he cannot meet Meredith's standards.  Pt shares he does feel like [name] has been bullying him.  We talked about how pt can interact with [name] and tell her to stop bullying him.  Encouraged pt to stay calm in these situations, to not raise his voice at [facility], and to state that he will not accept her bullying him anymore.  He states this is not the way there relationship has always been.  Encouraged pt to continue with his self care activities and we will meet in 2 wks for a follow up session.    Interventions: Cognitive Behavioral Therapy  Diagnosis:Generalized anxiety disorder  Plan: Treatment Plan Strengths/Abilities:  Intelligent, Intuitive, Willing to participate in therapy Treatment Preferences:  Outpatient Individual Therapy Statement of Needs:  Patient is to use CBT, mindfulness and coping skills to help manage and/or decrease symptoms associated with their diagnosis. Symptoms:  Depressed/Irritable mood, worry, social withdrawal Problems Addressed:  Depressive thoughts, Sadness, Sleep issues, etc. Long Term Goals:  Pt to reduce overall level, frequency, and intensity of the feelings of depression/anxiety as evidenced by decreased irritability, negative self talk, and helpless feelings from 6 to 7 days/week to 0 to 1 days/week, per client report, for at least 3 consecutive months.  Progress: 20% Short Term Goals:  Pt to verbally express understanding of the relationship between feelings of  depression/anxiety and their impact on thinking patterns and behaviors.  Pt to verbalize an understanding of the role that distorted thinking plays in creating fears, excessive worry, and ruminations.  Progress: 20% Target Date:  06/12/2022 Frequency:  Bi-weekly Modality:  Cognitive Behavioral Therapy Interventions by Therapist:  Therapist will use CBT, Mindfulness exercises, Coping skills and Referrals, as needed by client. Client has verbally approved this treatment plan.  Karie Kirks,  [facility]

## 2021-12-01 ENCOUNTER — Ambulatory Visit (INDEPENDENT_AMBULATORY_CARE_PROVIDER_SITE_OTHER): Payer: BC Managed Care – PPO | Admitting: Psychology

## 2021-12-01 DIAGNOSIS — F411 Generalized anxiety disorder: Secondary | ICD-10-CM | POA: Diagnosis not present

## 2021-12-01 NOTE — Progress Notes (Signed)
[facility] Counselor/Therapist Progress Note  Patient ID: Brian Maldonado, MRN: [id number],    Date: 12/01/2021  Time Spent: 45 mins  Treatment Type: Individual Therapy  Reported Symptoms: Pt presented for a follow up session, via webex video.  Pt granted consent for session, stating that he is in his car with no one else present.  I shared with pt that I am in my office with no one else here either.    Mental Status Exam: Appearance:  Casual     Behavior: Appropriate  Motor: Normal  Speech/Language:  Clear and Coherent  Affect: Appropriate  Mood: normal  Thought process: normal  Thought content:   WNL  Sensory/Perceptual disturbances:   WNL  Orientation: oriented to person, place, and time/date  Attention: Good  Concentration: Good  Memory: WNL  Fund of knowledge:  Good  Insight:   Good  Judgment:  Good  Impulse Control: Good   Risk Assessment: Danger to Self:  No Self-injurious Behavior: No Danger to Others: No Duty to Warn:no Physical Aggression / Violence:No  Access to Firearms a concern: No  Gang Involvement:No   Subjective: Pt asked to do a 30 min session today, due to constraints at work.  Pt shares that "It has been stressful, but not in the same way as before.  [name] went to [place] last weekend for a [employer] and she and [name] are at [name] this week for a few days with a friend of hers and her daughter."  Pt shares that he did talk with Meredith's mom about [name] bullying him and Elnita Maxwell agrees that behavior is not OK.  "In the past 2 wks, nothing has happened so I guess it has been OK."  Pt went to his parent's apt recently to retreive some music equipment and he had not seen his parents in a while.  He shares that "it was like going into an episode of that show 'Hoarders'; their home is just not in good shape."  Pt shares he did have some thoughts run through his head after that interaction.  It seems that he feels like something  is missing in his relationship with his parents.  His sister, her boyfriend and their daughter are living with his parents and have been for the past year.  Pt continues to go to the gym regularly, but he is not going every day like before.  He is intentional about going Th, F, and Sat.  He is working on things around the house while [name] and [employer] are out of town.  He talks with [name] and [employer] while they are out of town.  Encouraged pt to continue with his self care activities and we will meet in 3 wks for a follow up session.    Interventions: Cognitive Behavioral Therapy  Diagnosis:Generalized anxiety disorder  Plan: Treatment Plan Strengths/Abilities:  Intelligent, Intuitive, Willing to participate in therapy Treatment Preferences:  Outpatient Individual Therapy Statement of Needs:  Patient is to use CBT, mindfulness and coping skills to help manage and/or decrease symptoms associated with their diagnosis. Symptoms:  Depressed/Irritable mood, worry, social withdrawal Problems Addressed:  Depressive thoughts, Sadness, Sleep issues, etc. Long Term Goals:  Pt to reduce overall level, frequency, and intensity of the feelings of depression/anxiety as evidenced by decreased irritability, negative self talk, and helpless feelings from 6 to 7 days/week to 0 to 1 days/week, per client report, for at least 3 consecutive months.  Progress: 20% Short Term Goals:  Pt to  verbally express understanding of the relationship between feelings of depression/anxiety and their impact on thinking patterns and behaviors.  Pt to verbalize an understanding of the role that distorted thinking plays in creating fears, excessive worry, and ruminations.  Progress: 20% Target Date:  06/12/2022 Frequency:  Bi-weekly Modality:  Cognitive Behavioral Therapy Interventions by Therapist:  Therapist will use CBT, Mindfulness exercises, Coping skills and Referrals, as needed by client. Client has verbally approved this  treatment plan.  Karie Kirks, [employer]

## 2021-12-09 ENCOUNTER — Other Ambulatory Visit: Payer: Self-pay | Admitting: Family Medicine

## 2021-12-09 MED ORDER — AMPHETAMINE-DEXTROAMPHETAMINE 15 MG PO TABS: 15.0000 mg | ORAL_TABLET | Freq: Two times a day (BID) | ORAL | 0 refills | Status: DC

## 2021-12-09 NOTE — Telephone Encounter (Signed)
Refill request for amphetamine-dextroamphetamine (ADDERALL) 15 MG tablet  LOV - 08/22/20 Next OV - not scheduled Last refill - 11/10/21 #60/0

## 2021-12-09 NOTE — Telephone Encounter (Signed)
Sent. Thanks.   

## 2021-12-22 ENCOUNTER — Ambulatory Visit (INDEPENDENT_AMBULATORY_CARE_PROVIDER_SITE_OTHER): Payer: BC Managed Care – PPO | Admitting: Psychology

## 2021-12-22 DIAGNOSIS — F411 Generalized anxiety disorder: Secondary | ICD-10-CM

## 2022-01-06 ENCOUNTER — Telehealth: Payer: Self-pay

## 2022-01-06 ENCOUNTER — Other Ambulatory Visit: Payer: Self-pay | Admitting: Family Medicine

## 2022-01-06 MED ORDER — AMPHETAMINE-DEXTROAMPHETAMINE 15 MG PO TABS: 15.0000 mg | ORAL_TABLET | Freq: Two times a day (BID) | ORAL | 0 refills | Status: DC

## 2022-01-06 NOTE — Telephone Encounter (Signed)
Refill request for amphetamine-dextroamphetamine (ADDERALL) 15 MG tablet  LOV - 08/22/20 Next OV - not scheduled yet; sent message to get patient scheduled Last refill - 12/09/21 #60/0

## 2022-01-06 NOTE — Telephone Encounter (Signed)
Patient is overdue for CPE; please call to schedule patient.

## 2022-01-06 NOTE — Telephone Encounter (Signed)
Called patient and scheduled CPE on 02/09/2022 4:00 pm and lab 02/03/2022 at 4:00 pm

## 2022-01-11 ENCOUNTER — Ambulatory Visit: Payer: BC Managed Care – PPO | Admitting: Behavioral Health

## 2022-01-18 ENCOUNTER — Encounter: Payer: Self-pay | Admitting: Behavioral Health

## 2022-01-18 ENCOUNTER — Telehealth (INDEPENDENT_AMBULATORY_CARE_PROVIDER_SITE_OTHER): Payer: BC Managed Care – PPO | Admitting: Behavioral Health

## 2022-01-18 DIAGNOSIS — F411 Generalized anxiety disorder: Secondary | ICD-10-CM | POA: Diagnosis not present

## 2022-01-18 DIAGNOSIS — F3289 Other specified depressive episodes: Secondary | ICD-10-CM | POA: Diagnosis not present

## 2022-01-18 DIAGNOSIS — F902 Attention-deficit hyperactivity disorder, combined type: Secondary | ICD-10-CM | POA: Diagnosis not present

## 2022-01-18 MED ORDER — AMPHETAMINE-DEXTROAMPHETAMINE 15 MG PO TABS: 15.0000 mg | ORAL_TABLET | Freq: Two times a day (BID) | ORAL | 0 refills | Status: DC

## 2022-01-18 MED ORDER — SERTRALINE HCL 100 MG PO TABS: 100.0000 mg | ORAL_TABLET | Freq: Every day | ORAL | 4 refills | Status: DC

## 2022-01-18 NOTE — Progress Notes (Signed)
Brian Maldonado [id number] 10-Nov-1985 36 y.o.  Virtual Visit via Video Note  I connected with pt @ on 01/18/22 at  3:00 PM EDT by a video enabled telemedicine application and verified that I am speaking with the correct person using two identifiers.   I discussed the limitations of evaluation and management by telemedicine and the availability of in person appointments. The patient expressed understanding and agreed to proceed.  I discussed the assessment and treatment plan with the patient. The patient was provided an opportunity to ask questions and all were answered. The patient agreed with the plan and demonstrated an understanding of the instructions.   The patient was advised to call back or seek an in-person evaluation if the symptoms worsen or if the condition fails to improve as anticipated.  I provided 20 minutes of non-face-to-face time during this encounter.  The patient was located at home.  The provider was located at [facility].   Joan Flores, NP   Subjective:   Patient ID:  Brian Maldonado is a 36 y.o. (DOB [birth year]) male.  Chief Complaint: No chief complaint on file.   HPI [name], 36 year old male presents to this office via video visit for follow up and medication management. He says that he continues to noticed significant improvement with anxiety and depression since last visit.  His hair pulling has improved to almost zero since using NAC supplement.  No more bald patches in beard. He does not want to change his medications at this time. Says his anxiety today is 1/10 and depression is 0/10. He is sleeping 7-8 hours per night. No mania, no psychosis, No SI/HI.   No previous psychiatric medication failures   Review of Systems:  Review of Systems  Constitutional: Negative.   Musculoskeletal:  Negative for gait problem.  Allergic/Immunologic: Negative.   Neurological:  Negative for tremors.  Psychiatric/Behavioral: Negative.       Medications: I have reviewed the patient's current medications.  Current Outpatient Medications  Medication Sig Dispense Refill   amphetamine-dextroamphetamine (ADDERALL) 15 MG tablet Take 1 tablet by mouth 2 (two) times daily. PLEASE CONTACT [facility] FOR AN APPOINTMENT.  CALL [phone] OR USE MYCHART. 60 tablet 0   azithromycin (ZITHROMAX) 250 MG tablet Take 500 mg once, then 250 mg for four days (Patient not taking: Reported on 04/28/2021) 6 tablet 0   Glutamine 500 MG TABS Take by mouth every morning.     Multiple Vitamin (MULTIVITAMIN) tablet Take 1 tablet by mouth daily.     omeprazole (PRILOSEC) 20 MG capsule Take 20 mg by mouth daily as needed. (Patient not taking: Reported on 04/28/2021)     OVER THE COUNTER MEDICATION Branch [employer] Amino Acids - after workouts.     predniSONE (STERAPRED UNI-PAK 21 TAB) 10 MG (21) TBPK tablet Use as directed (Patient not taking: Reported on 04/28/2021) 21 tablet 0   sertraline (ZOLOFT) 100 MG tablet Take 1 tablet (100 mg total) by mouth daily. 30 tablet 4   vitamin B-12 (CYANOCOBALAMIN) 1000 MCG tablet Take 1,000 mcg by mouth daily.     No current facility-administered medications for this visit.    Medication Side Effects: None  Allergies:  Allergies  Allergen Reactions   Sulfa Antibiotics     Unknown;  Had as a child and does not recall reaction    Past Medical History:  Diagnosis Date   Cervicogenic headache 07/01/2015   Left side   GERD (gastroesophageal reflux  disease)    Headache    Left shoulder pain    S/P dilatation of esophageal stricture    Sleep apnea 2018    Family History  Problem Relation Age of Onset   Drug abuse Mother    Sleep apnea Mother    Migraines Mother    Drug abuse Father    ADD / ADHD Father    Diabetes Father    Sleep apnea Father    Prostate cancer Paternal Uncle    Prostate cancer Paternal Grandfather    Colon cancer Neg Hx     Social History   Socioeconomic History   Marital  status: Married    Spouse name: Not on file   Number of children: 1   Years of education: some coll.   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   Patient is left handed.    Married 2012   Daughter [name] born 2017   From [place].  Working for [employer].     Enjoys playing bass guitar.     Social Determinants of Health   Financial Resource Strain: Not on file  Food Insecurity: Not on file  Transportation Needs: Not on file  Physical Activity: Not on file  Stress: Not on file  Social Connections: Not on file  Intimate Partner Violence: Not on file    Past Medical History, Surgical history, Social history, and Family history were reviewed and updated as appropriate.   Please see review of systems for further details on the patient's review from today.   Objective:   Physical Exam:  There were no vitals taken for this visit.  Physical Exam Constitutional:      General: He is not in acute distress.    Appearance: Normal appearance.  Neurological:     Mental Status: He is alert and oriented to person, place, and time.     Gait: Gait normal.  Psychiatric:        Attention and Perception: Attention and perception normal. He does not perceive auditory or visual hallucinations.        Mood and Affect: Mood and affect normal. Mood is not anxious or depressed. Affect is not labile.        Speech: Speech normal.        Behavior: Behavior normal. Behavior is cooperative.        Thought Content: Thought content  normal.        Cognition and Memory: Cognition and memory normal.        Judgment: Judgment normal.     Lab Review:     Component Value Date/Time   NA 135 08/22/2020 1259   NA 140 04/07/2013 1842   K 4.1 08/22/2020 1259   K 4.0 04/07/2013 1842   CL 101 08/22/2020 1259   CL 108 (H) 04/07/2013 1842   CO2 27 08/22/2020 1259   CO2 28 04/07/2013 1842   GLUCOSE 93 08/22/2020 1259   GLUCOSE 140 (  H) 04/07/2013 1842   BUN 39 (H) 08/22/2020 1259   BUN 15 04/07/2013 1842   CREATININE 1.12 08/22/2020 1259   CREATININE 1.14 04/07/2013 1842   CALCIUM 9.8 08/22/2020 1259   CALCIUM 9.9 04/07/2013 1842   GFRNONAA >60 07/10/2018 2033   GFRNONAA >60 04/07/2013 1842   GFRAA >60 07/10/2018 2033   GFRAA >60 04/07/2013 1842       Component Value Date/Time   WBC 9.7 08/22/2020 1259   RBC 4.43 08/22/2020 1259   HGB 14.0 08/22/2020 1259   HGB 15.1 04/07/2013 1842   HCT 40.9 08/22/2020 1259   HCT 42.6 04/07/2013 1842   PLT 188.0 08/22/2020 1259   PLT 186 04/07/2013 1842   MCV 92.3 08/22/2020 1259   MCV 90 04/07/2013 1842   MCH 31.4 07/10/2018 2033   MCHC 34.2 08/22/2020 1259   RDW 12.8 08/22/2020 1259   RDW 12.4 04/07/2013 1842   LYMPHSABS 1.1 08/22/2020 1259   MONOABS 0.7 08/22/2020 1259   EOSABS 0.0 08/22/2020 1259   BASOSABS 0.0 08/22/2020 1259    No results found for: "POCLITH", "LITHIUM"   No results found for: "PHENYTOIN", "PHENOBARB", "VALPROATE", "CBMZ"   .res Assessment: Plan:    Greater than 50% of  30 min face to face time with patient was spent on counseling and coordination of care. We discussed his significant improvement  with anxiety and depression. He is very pleased with his progress and medication. Reports a 90%  improvement with hair pulling. He does not want to adjust medications at this time. He is continuing in psychotherapy monthly.    We agreed to:   To Continue Zoloft to 100 mg daily Continue NAC 3600 mg daily To take with a little food to help with  any nausea. Will report side effects or worsening symptoms To follow up in 6 months to reassess per pt. Provided emergency contact information Reviewed PDMP    There are no diagnoses linked to this encounter.   Please see After Visit Summary for patient specific instructions.  Future Appointments  Date Time Provider Department Center  01/28/2022 11:00 AM Karie Kirks [facility] None  02/03/2022  4:00 PM [facility]  02/09/2022  4:00 PM Joaquim Nam, MD [facility]    No orders of the defined types were placed in this encounter.     -------------------------------

## 2022-01-21 ENCOUNTER — Ambulatory Visit: Payer: BC Managed Care – PPO | Admitting: Psychology

## 2022-01-24 ENCOUNTER — Other Ambulatory Visit: Payer: Self-pay | Admitting: Family Medicine

## 2022-01-24 DIAGNOSIS — F988 Other specified behavioral and emotional disorders with onset usually occurring in childhood and adolescence: Secondary | ICD-10-CM

## 2022-01-28 ENCOUNTER — Ambulatory Visit (INDEPENDENT_AMBULATORY_CARE_PROVIDER_SITE_OTHER): Payer: BC Managed Care – PPO | Admitting: Psychology

## 2022-01-28 DIAGNOSIS — F411 Generalized anxiety disorder: Secondary | ICD-10-CM

## 2022-01-28 NOTE — Progress Notes (Addendum)
[facility] Counselor/Therapist Progress Note  Patient ID: Brian Maldonado, MRN: [id number],    Date: 01/28/2022  Time Spent: 45 mins  Treatment Type: Individual Therapy  Reported Symptoms: Pt presented for a follow up session, via webex video.  Pt granted consent for session, stating that he is in his car with no one else present.  I shared with pt that I am in my office with no one else here either.    Mental Status Exam: Appearance:  Casual     Behavior: Appropriate  Motor: Normal  Speech/Language:  Clear and Coherent  Affect: Appropriate  Mood: normal  Thought process: normal  Thought content:   WNL  Sensory/Perceptual disturbances:   WNL  Orientation: oriented to person, place, and time/date  Attention: Good  Concentration: Good  Memory: WNL  Fund of knowledge:  Good  Insight:   Good  Judgment:  Good  Impulse Control: Good   Risk Assessment: Danger to Self:  No Self-injurious Behavior: No Danger to Others: No Duty to Warn:no Physical Aggression / Violence:No  Access to Firearms a concern: No  Gang Involvement:No   Subjective: Pt shares that he has "been doing pretty good.  Home life has been pretty good; we are having some issues but it is mostly good.  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  Pt shares again that they had a nice trip to the beach as a family.  Brian Maldonado's mom and her brother and his girlfriend went with them (pt, Brian Maldonado, and [name]) to [name].  They were there for the week of his birthday.  They played at [employer], rode go [employer], had a nice dinner, etc.  He shares that his parents did not call him for his birthday; he called them later and talked to his dad.  His dad told him he tried to call him but pt did not have any missed calls.  Pt shares that Brian Maldonado did not post anything about it being his birthday; he  says he posts on all of their celebration days and he just wanted her to care enough for him to post about his birthday.  The fact that she did not post about him hurt his feelings ("she did post a half-assed message.").  Pt shares that [name] had a great time at the beach; he played with her on the beach and in the pool.  She is starting the first grade this month and is looking forward to school.  Pt shares that he has not been playing a lot of music since they left their last church.  They thought they had found a new church to attend but it did not work out.  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  Pt continues to work out M, Th, and Sat and is thinking about adding a day of cardio as well.  He shares that he and Brian Maldonado are still trying to have a baby but she is not pregnant yet.  Brian Maldonado is still wanting to get pregnant and pt supports the idea for her.  Pt shares that he has not been as engaged in self care activities as much as he might need to be; he has bee working out and has bee spending intentional time with  Brian Maldonado and [employer].  Encouraged pt to be as intentional as possible about engaging in self care activities for himself (reading, movies, or TV).  Encouraged pt to continue with his self care activities and we will meet in 3 wks for a follow up session.  Interventions: Cognitive Behavioral Therapy  Diagnosis:Generalized anxiety disorder  Plan: Treatment Plan Strengths/Abilities:  Intelligent, Intuitive, Willing to participate in therapy Treatment Preferences:  Outpatient Individual Therapy Statement of Needs:  Patient is to use CBT, mindfulness and coping skills to help manage and/or decrease symptoms associated with their diagnosis. Symptoms:  Depressed/Irritable mood, worry, social withdrawal Problems Addressed:  Depressive thoughts, Sadness, Sleep issues, etc. Long Term Goals:  Pt to reduce  overall level, frequency, and intensity of the feelings of depression/anxiety as evidenced by decreased irritability, negative self talk, and helpless feelings from 6 to 7 days/week to 0 to 1 days/week, per client report, for at least 3 consecutive months.  Progress: 20% Short Term Goals:  Pt to verbally express understanding of the relationship between feelings of depression/anxiety and their impact on thinking patterns and behaviors.  Pt to verbalize an understanding of the role that distorted thinking plays in creating fears, excessive worry, and ruminations.  Progress: 20% Target Date:  06/12/2022 Frequency:  Bi-weekly Modality:  Cognitive Behavioral Therapy Interventions by Therapist:  Therapist will use CBT, Mindfulness exercises, Coping skills and Referrals, as needed by client. Client has verbally approved this treatment plan.  Karie Kirks, [facility]

## 2022-01-29 ENCOUNTER — Encounter: Payer: Self-pay | Admitting: Family Medicine

## 2022-02-03 ENCOUNTER — Other Ambulatory Visit (INDEPENDENT_AMBULATORY_CARE_PROVIDER_SITE_OTHER): Payer: BC Managed Care – PPO

## 2022-02-03 DIAGNOSIS — F988 Other specified behavioral and emotional disorders with onset usually occurring in childhood and adolescence: Secondary | ICD-10-CM

## 2022-02-04 LAB — CBC WITH DIFFERENTIAL/PLATELET
Basophils Absolute: 0 10*3/uL (ref 0.0–0.1)
Basophils Relative: 0.7 % (ref 0.0–3.0)
Eosinophils Absolute: 0.2 10*3/uL (ref 0.0–0.7)
Eosinophils Relative: 3.5 % (ref 0.0–5.0)
HCT: 40.9 % (ref 39.0–52.0)
Hemoglobin: 14.2 g/dL (ref 13.0–17.0)
Lymphocytes Relative: 23.1 % (ref 12.0–46.0)
Lymphs Abs: 1.2 10*3/uL (ref 0.7–4.0)
MCHC: 34.7 g/dL (ref 30.0–36.0)
MCV: 90 fl (ref 78.0–100.0)
Monocytes Absolute: 0.5 10*3/uL (ref 0.1–1.0)
Monocytes Relative: 9.2 % (ref 3.0–12.0)
Neutro Abs: 3.2 10*3/uL (ref 1.4–7.7)
Neutrophils Relative %: 63.5 % (ref 43.0–77.0)
Platelets: 186 10*3/uL (ref 150.0–400.0)
RBC: 4.54 Mil/uL (ref 4.22–5.81)
RDW: 12.4 % (ref 11.5–15.5)
WBC: 5.1 10*3/uL (ref 4.0–10.5)

## 2022-02-04 LAB — BASIC METABOLIC PANEL
BUN: 30 mg/dL — ABNORMAL HIGH (ref 6–23)
CO2: 25 mEq/L (ref 19–32)
Calcium: 9.3 mg/dL (ref 8.4–10.5)
Chloride: 99 mEq/L (ref 96–112)
Creatinine, Ser: 1.07 mg/dL (ref 0.40–1.50)
GFR: 89.56 mL/min (ref >60.00)
Glucose, Bld: 107 mg/dL — ABNORMAL HIGH (ref 70–99)
Potassium: 4 mEq/L (ref 3.5–5.1)
Sodium: 139 mEq/L (ref 135–145)

## 2022-02-04 LAB — TSH: TSH: 2.28 u[IU]/mL (ref 0.35–5.50)

## 2022-02-09 ENCOUNTER — Encounter: Payer: Self-pay | Admitting: Family Medicine

## 2022-02-09 ENCOUNTER — Ambulatory Visit (INDEPENDENT_AMBULATORY_CARE_PROVIDER_SITE_OTHER): Payer: BC Managed Care – PPO | Admitting: Family Medicine

## 2022-02-09 VITALS — BP 122/78 | HR 91 | Temp 97.8°F | Ht 69.0 in | Wt 216.0 lb

## 2022-02-09 DIAGNOSIS — F988 Other specified behavioral and emotional disorders with onset usually occurring in childhood and adolescence: Secondary | ICD-10-CM

## 2022-02-09 DIAGNOSIS — Z7189 Other specified counseling: Secondary | ICD-10-CM

## 2022-02-09 DIAGNOSIS — G4733 Obstructive sleep apnea (adult) (pediatric): Secondary | ICD-10-CM

## 2022-02-09 DIAGNOSIS — Z0000 Encounter for general adult medical examination without abnormal findings: Secondary | ICD-10-CM | POA: Diagnosis not present

## 2022-02-09 DIAGNOSIS — Z8719 Personal history of other diseases of the digestive system: Secondary | ICD-10-CM

## 2022-02-09 NOTE — Progress Notes (Unsigned)
CPE- See plan.  Routine anticipatory guidance given to patient.  See health maintenance.  The possibility exists that previously documented standard health maintenance information may have been brought forward from a previous encounter into this note.  If needed, that same information has been updated to reflect the current situation based on today's encounter.    Tetanus 2016.   Flu prev encouraged. To be done at work.   covid vaccine encouraged. PNA and shingles not due Colon and prostate cancer screening not due.  Living will d/w pt.  Wife designated if patient were incapacitated.    ADD d/w pt.  Compliant.  Improved from prior.  Started on zoloft in the meantime.  Per patient psych is going to start addressing stimulant refill.  It took months for SSRI to help with anxiety but it eventually helped.    He is using a spica brace at night and that helps some.  Some variable sx.    H/o CPAP.  He has trouble with mask fit.  Refer to pulmonary.  Cautions d/w pt.    GERD is generally controlled and he is avoiding trigger foods.  Dysphagia isn't worse, prev dilation helped.    We talked about low T testing.  I would prefer him to get set up with pulmonary first.  Rationale d/w pt.  He agrees.    PMH and SH reviewed  Meds, vitals, and allergies reviewed.   ROS: Per HPI.  Unless specifically indicated otherwise in HPI, the patient denies:  General: fever. Eyes: acute vision changes ENT: sore throat Cardiovascular: chest pain Respiratory: SOB GI: vomiting GU: dysuria Musculoskeletal: acute back pain Derm: acute rash Neuro: acute motor dysfunction Psych: worsening mood Endocrine: polydipsia Heme: bleeding Allergy: hayfever  GEN: nad, alert and oriented HEENT: mucous membranes moist NECK: supple w/o LA CV: rrr. PULM: ctab, no inc wob ABD: soft, +bs EXT: no edema SKIN: no acute rash

## 2022-02-09 NOTE — Patient Instructions (Signed)
Thanks for your effort.  Take care.  Glad to see you. We'll call about seeing pulmonary.

## 2022-02-10 ENCOUNTER — Other Ambulatory Visit: Payer: Self-pay | Admitting: Behavioral Health

## 2022-02-10 DIAGNOSIS — F902 Attention-deficit hyperactivity disorder, combined type: Secondary | ICD-10-CM

## 2022-02-10 DIAGNOSIS — G4733 Obstructive sleep apnea (adult) (pediatric): Secondary | ICD-10-CM | POA: Insufficient documentation

## 2022-02-10 NOTE — Assessment & Plan Note (Addendum)
  H/o OSA.  He has trouble with mask fit.  Refer to pulmonary.  Cautions d/w pt.    We talked about low T testing.  I would prefer him to get set up with pulmonary first.  Rationale d/w pt.  He agrees to defer testosterone evaluation.

## 2022-02-10 NOTE — Assessment & Plan Note (Signed)
GERD is generally controlled and he is avoiding trigger foods.  Dysphagia isn't worse, prev dilation helped.   He can update me as needed.  We can refer back to GI if needed.

## 2022-02-10 NOTE — Assessment & Plan Note (Signed)
ADD d/w pt.  Compliant.  Improved from prior.  Started on zoloft in the meantime.  Per patient psych is going to start addressing stimulant refill.  It took months for SSRI to help with anxiety but it eventually helped.   No change in medications at this point.  Future refills can come through psychiatry.  I appreciate the help of all involved.

## 2022-02-10 NOTE — Assessment & Plan Note (Signed)
Living will d/w pt.  Wife designated if patient were incapacitated.   ?

## 2022-02-10 NOTE — Assessment & Plan Note (Signed)
Tetanus 2016.   Flu prev encouraged. To be done at work.   covid vaccine encouraged. PNA and shingles not due Colon and prostate cancer screening not due.  Living will d/w pt.  Wife designated if patient were incapacitated.

## 2022-02-16 ENCOUNTER — Ambulatory Visit (INDEPENDENT_AMBULATORY_CARE_PROVIDER_SITE_OTHER): Payer: BC Managed Care – PPO | Admitting: Psychology

## 2022-02-16 DIAGNOSIS — F411 Generalized anxiety disorder: Secondary | ICD-10-CM | POA: Diagnosis not present

## 2022-02-16 NOTE — Progress Notes (Signed)
[facility] Counselor/Therapist Progress Note  Patient ID: Brian Maldonado, MRN: [id number],    Date: 02/16/2022  Time Spent: 30 mins  Treatment Type: Individual Therapy  Reported Symptoms: Pt presented for a follow up session, via webex video.  Pt granted consent for session, stating that he is in his car with no one else present.  I shared with pt that I am in my office with no one else here either.    Mental Status Exam: Appearance:  Casual     Behavior: Appropriate  Motor: Normal  Speech/Language:  Clear and Coherent  Affect: Appropriate  Mood: normal  Thought process: normal  Thought content:   WNL  Sensory/Perceptual disturbances:   WNL  Orientation: oriented to person, place, and time/date  Attention: Good  Concentration: Good  Memory: WNL  Fund of knowledge:  Good  Insight:   Good  Judgment:  Good  Impulse Control: Good   Risk Assessment: Danger to Self:  No Self-injurious Behavior: No Danger to Others: No Duty to Warn:no Physical Aggression / Violence:No  Access to Firearms a concern: No  Gang Involvement:No   Subjective: Pt shares that he has "been doing good; nothing really new since our last session.  [name] and I have not been arguing; we had a tiff on Friday night because I was not listening as well as I should have.  We sat down and talked about it and it went really well.  I was able to explain my perspective and it seemed to be OK."  Pt shares that he asked [name] to know that, if he does something wrong, it represents a mistake.  He is not trying to upset her and is no longer lying to her.  Pt shares that he has been trying to do self care activities for him; he continues to go to the gym and is trying to spend time with [name] and [name].  [name] started school last week and she seems to be enjoying it.  She is also doing dance classes and they have a competition coming up; they are going to [place] for a weekend in Jan; they are planning  to go to [place] while they are there.  She is also taking singing lessons and piano lessons, but she does not like piano as much.  They are trying to determine whether she does not like piano or is it just hard for her at the beginning.  Pt shares that work continues to be stressful for him; "everything seems to be shrouded in mystery."  Pt shares he has a mtg this Thursday with his boss "about a time card issue; I don't know what that is about."  Encouraged pt to think about telling his boss that she can approach him at any time with any issue so that he does not have to wait several days to learn what is going on.  Pt also shares that he thinks they may have found a church they can be happy with; we will talk more about that in our follow up session in 2 wks.  Encouraged pt to be as intentional as possible about engaging in self care activities for himself (working out, reading, movies, or TV).  Interventions: Cognitive Behavioral Therapy  Diagnosis:Generalized anxiety disorder  Plan: Treatment Plan Strengths/Abilities:  Intelligent, Intuitive, Willing to participate in therapy Treatment Preferences:  Outpatient Individual Therapy Statement of Needs:  Patient is to use CBT, mindfulness and coping skills to help manage and/or decrease symptoms associated with  their diagnosis. Symptoms:  Depressed/Irritable mood, worry, social withdrawal Problems Addressed:  Depressive thoughts, Sadness, Sleep issues, etc. Long Term Goals:  Pt to reduce overall level, frequency, and intensity of the feelings of depression/anxiety as evidenced by decreased irritability, negative self talk, and helpless feelings from 6 to 7 days/week to 0 to 1 days/week, per client report, for at least 3 consecutive months.  Progress: 20% Short Term Goals:  Pt to verbally express understanding of the relationship between feelings of depression/anxiety and their impact on thinking patterns and behaviors.  Pt to verbalize an understanding  of the role that distorted thinking plays in creating fears, excessive worry, and ruminations.  Progress: 20% Target Date:  06/12/2022 Frequency:  Bi-weekly Modality:  Cognitive Behavioral Therapy Interventions by Therapist:  Therapist will use CBT, Mindfulness exercises, Coping skills and Referrals, as needed by client. Client has verbally approved this treatment plan.  Karie Kirks, [facility]

## 2022-02-22 ENCOUNTER — Telehealth: Payer: BC Managed Care – PPO | Admitting: Nurse Practitioner

## 2022-02-22 DIAGNOSIS — J40 Bronchitis, not specified as acute or chronic: Secondary | ICD-10-CM | POA: Diagnosis not present

## 2022-02-22 MED ORDER — AZITHROMYCIN 250 MG PO TABS: ORAL_TABLET | ORAL | 0 refills | Status: AC

## 2022-02-22 MED ORDER — ALBUTEROL SULFATE HFA 108 (90 BASE) MCG/ACT IN AERS: 2.0000 | INHALATION_SPRAY | Freq: Four times a day (QID) | RESPIRATORY_TRACT | 0 refills | Status: AC | PRN

## 2022-02-22 NOTE — Progress Notes (Signed)
Virtual Visit Consent   Brian Maldonado, you are scheduled for a virtual visit with a [facility] provider today. Just as with appointments in the office, your consent must be obtained to participate. Your consent will be active for this visit and any virtual visit you may have with one of our providers in the next 365 days. If you have a MyChart account, a copy of this consent can be sent to you electronically.  As this is a virtual visit, video technology does not allow for your provider to perform a traditional examination. This may limit your provider's ability to fully assess your condition. If your provider identifies any concerns that need to be evaluated in person or the need to arrange testing (such as labs, EKG, etc.), we will make arrangements to do so. Although advances in technology are sophisticated, we cannot ensure that it will always work on either your end or our end. If the connection with a video visit is poor, the visit may have to be switched to a telephone visit. With either a video or telephone visit, we are not always able to ensure that we have a secure connection.  By engaging in this virtual visit, you consent to the provision of healthcare and authorize for your insurance to be billed (if applicable) for the services provided during this visit. Depending on your insurance coverage, you may receive a charge related to this service.  I need to obtain your verbal consent now. Are you willing to proceed with your visit today? MIRON MARXEN has provided verbal consent on 02/22/2022 for a virtual visit (video or telephone). Viviano Simas, FNP  Date: 02/22/2022 10:44 AM  Virtual Visit via Video Note   I, Viviano Simas, connected with  Brian Maldonado  ([id number], [birth year]) on 02/22/22 at 10:45 AM EDT by a video-enabled telemedicine application and verified that I am speaking with the correct person using two identifiers.  Location: Patient: Virtual Visit Location Patient:  Home Provider: Virtual Visit Location Provider: Home Office   I discussed the limitations of evaluation and management by telemedicine and the availability of in person appointments. The patient expressed understanding and agreed to proceed.    History of Present Illness: Brian Maldonado is a 36 y.o. who identifies as a male who was assigned male at birth, and is being seen today for a cough.  The cough started 2 weeks ago. The cough has worsened over the past few days, it comes and goes throughout the day.  The more he talks or sings the cough worsens and then becomes productive. He has noted that the mucous has been colored as well.   He has been using Delsym and tylenol for relief and halls as needed.   Denied other symptoms  Denies fever or other systemic symptoms  Denies any recent travel   He has used an inhaler in the past for URI denies a history of asthma    Problems:  Patient Active Problem List   Diagnosis Date Noted   OSA (obstructive sleep apnea) 02/10/2022   TMJ click 08/24/2020   Raynaud's phenomenon 08/24/2020   Attention deficit disorder 06/13/2019   FH: prostate cancer 06/12/2019   Routine general medical examination at a health care facility 01/17/2017   Cold sore 01/17/2017   S/P dilatation of esophageal stricture 06/02/2016   Carpal tunnel syndrome 06/02/2016   Advance care planning 06/01/2016    Allergies:  Allergies  Allergen Reactions   Sulfa Antibiotics  Unknown;  Had as a child and does not recall reaction   Medications:  Current Outpatient Medications:    amphetamine-dextroamphetamine (ADDERALL) 15 MG tablet, Take 1 tablet by mouth 2 (two) times daily. Take one tablet twice daily., Disp: 60 tablet, Rfl: 0   Glutamine 500 MG TABS, Take by mouth every morning., Disp: , Rfl:    Multiple Vitamin (MULTIVITAMIN) tablet, Take 1 tablet by mouth daily., Disp: , Rfl:    OVER THE COUNTER MEDICATION, Branch Chain Amino Acids - after workouts., Disp: ,  Rfl:    sertraline (ZOLOFT) 100 MG tablet, Take 1 tablet (100 mg total) by mouth daily., Disp: 30 tablet, Rfl: 4   vitamin B-12 (CYANOCOBALAMIN) 1000 MCG tablet, Take 1,000 mcg by mouth daily., Disp: , Rfl:   Observations/Objective: Patient is well-developed, well-nourished in no acute distress.  Resting comfortably  at home.  Head is normocephalic, atraumatic.  No labored breathing.  Speech is clear and coherent with logical content.  Patient is alert and oriented at baseline.    Assessment and Plan: 1. Bronchitis  - azithromycin (ZITHROMAX) 250 MG tablet; Take 2 tablets on day 1, then 1 tablet daily on days 2 through 5  Dispense: 6 tablet; Refill: 0 - albuterol (VENTOLIN HFA) 108 (90 Base) MCG/ACT inhaler; Inhale 2 puffs into the lungs every 6 (six) hours as needed for wheezing or shortness of breath.  Dispense: 8 g; Refill: 0    Use Mucinex OTC as needed   Follow Up Instructions: I discussed the assessment and treatment plan with the patient. The patient was provided an opportunity to ask questions and all were answered. The patient agreed with the plan and demonstrated an understanding of the instructions.  A copy of instructions were sent to the patient via MyChart unless otherwise noted below.   The patient was advised to call back or seek an in-person evaluation if the symptoms worsen or if the condition fails to improve as anticipated.  Time:  I spent 10 minutes with the patient via telehealth technology discussing the above problems/concerns.    Viviano Simas, FNP

## 2022-03-02 ENCOUNTER — Ambulatory Visit (INDEPENDENT_AMBULATORY_CARE_PROVIDER_SITE_OTHER): Payer: BC Managed Care – PPO | Admitting: Psychology

## 2022-03-02 DIAGNOSIS — F411 Generalized anxiety disorder: Secondary | ICD-10-CM | POA: Diagnosis not present

## 2022-03-02 NOTE — Progress Notes (Signed)
[facility] Counselor/Therapist Progress Note  Patient ID: Brian Maldonado, MRN: [id number],    Date: 03/02/2022  Time Spent: 30 mins  Treatment Type: Individual Therapy  Reported Symptoms: Pt presented for a follow up session, via webex video.  Pt granted consent for session, stating that he is in his car with no one else present.  I shared with pt that I am in my office with no one else here either.    Mental Status Exam: Appearance:  Casual     Behavior: Appropriate  Motor: Normal  Speech/Language:  Clear and Coherent  Affect: Appropriate  Mood: normal  Thought process: normal  Thought content:   WNL  Sensory/Perceptual disturbances:   WNL  Orientation: oriented to person, place, and time/date  Attention: Good  Concentration: Good  Memory: WNL  Fund of knowledge:  Good  Insight:   Good  Judgment:  Good  Impulse Control: Good   Risk Assessment: Danger to Self:  No Self-injurious Behavior: No Danger to Others: No Duty to Warn:no Physical Aggression / Violence:No  Access to Firearms a concern: No  Gang Involvement:No   Subjective: Pt shares that he has "been doing really good; nothing really new since our last session.  Work is going better."  Pt shares that work had been difficult "and I was a little miserable with work at our last session.  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  Pt feels like this situation has had a positive impact on pt's life and his perspective.  Pt shares that the last three Sundays there have been about 1800 people attending and they did not expect that big a crowd.  Pt shares that [name] is doing well at school and she is enjoying it.  Pt shares that he is now having to work back in the office (all staff are  required to return to the office) and he would prefer to be able to work from home.  It is a small frustration for him but it is not bad.  Pt shares that he went to a concert with [name] and her dad this past weekend.  His parents were evicted this past weekend from their apt and his dad waited until the last minute to ask for pt's help.  They did have a place to move to so they are safe at this point.  Encouraged pt to be as intentional as possible about engaging in self care activities for himself (working out, reading, movies, or TV).  Interventions: Cognitive Behavioral Therapy  Diagnosis:Generalized anxiety disorder  Plan: Treatment Plan Strengths/Abilities:  Intelligent, Intuitive, Willing to participate in therapy Treatment Preferences:  Outpatient Individual Therapy Statement of Needs:  Patient is to use CBT, mindfulness and coping skills to help manage and/or decrease symptoms associated with their diagnosis. Symptoms:  Depressed/Irritable mood, worry, social withdrawal Problems Addressed:  Depressive thoughts, Sadness, Sleep issues, etc. Long Term Goals:  Pt to reduce overall level, frequency, and intensity of the feelings of depression/anxiety as evidenced by decreased irritability, negative self talk, and helpless feelings from 6 to 7 days/week to 0 to 1 days/week, per client report, for at least 3 consecutive months.  Progress: 20% Short Term Goals:  Pt to verbally express understanding  of the relationship between feelings of depression/anxiety and their impact on thinking patterns and behaviors.  Pt to verbalize an understanding of the role that distorted thinking plays in creating fears, excessive worry, and ruminations.  Progress: 20% Target Date:  06/12/2022 Frequency:  Bi-weekly Modality:  Cognitive Behavioral Therapy Interventions by Therapist:  Therapist will use CBT, Mindfulness exercises, Coping skills and Referrals, as needed by client. Client has verbally approved this  treatment plan.  Karie Kirks, [facility]

## 2022-03-11 ENCOUNTER — Telehealth: Payer: Self-pay | Admitting: Behavioral Health

## 2022-03-11 ENCOUNTER — Other Ambulatory Visit: Payer: Self-pay

## 2022-03-11 DIAGNOSIS — F902 Attention-deficit hyperactivity disorder, combined type: Secondary | ICD-10-CM

## 2022-03-11 MED ORDER — AMPHETAMINE-DEXTROAMPHETAMINE 15 MG PO TABS: 15.0000 mg | ORAL_TABLET | Freq: Two times a day (BID) | ORAL | 0 refills | Status: DC

## 2022-03-11 NOTE — Telephone Encounter (Signed)
Pended.

## 2022-03-11 NOTE — Telephone Encounter (Signed)
Next visit is 04/20/22. Requesting refill on Adderall 15 mg called to:  [facility] - [place], [place] [address]  Phone:  [phone]  Fax:  [phone]    Per patient it is in stock.

## 2022-03-25 ENCOUNTER — Encounter: Payer: Self-pay | Admitting: Pulmonary Disease

## 2022-03-25 ENCOUNTER — Ambulatory Visit (INDEPENDENT_AMBULATORY_CARE_PROVIDER_SITE_OTHER): Payer: BC Managed Care – PPO | Admitting: Pulmonary Disease

## 2022-03-25 VITALS — BP 122/76 | HR 80 | Temp 97.5°F | Ht 69.0 in | Wt 226.0 lb

## 2022-03-25 DIAGNOSIS — G4733 Obstructive sleep apnea (adult) (pediatric): Secondary | ICD-10-CM

## 2022-03-25 DIAGNOSIS — Z789 Other specified health status: Secondary | ICD-10-CM

## 2022-03-25 MED ORDER — AZELASTINE HCL 0.15 % NA SOLN: 1.0000 | Freq: Every morning | NASAL | Status: AC

## 2022-03-25 MED ORDER — FLUTICASONE PROPIONATE 50 MCG/ACT NA SUSP
1.0000 | Freq: Every evening | NASAL | 2 refills | Status: AC
Start: 2022-03-25 — End: ?

## 2022-03-25 NOTE — Patient Instructions (Addendum)
Will arrange for a referral to Dr. Augustina Mood to get fitted for a mandibular advancement device to treat obstructive sleep apnea.  Try using astepro in the morning and flonase at night.  Follow up in 6 months.

## 2022-03-25 NOTE — Progress Notes (Signed)
[facility]  Chief Complaint  Patient presents with   Sleep Consult    Referred by Dr. Elsie Stain- known OSA- was on CPAP but not used in the past few months-"can't breathe". He has nasal congestion.     Past Surgical History:  He  has a past surgical history that includes Lipoma excision; Esophageal dilation; and Shoulder arthroscopy.  Past Medical History:  Headache, GERD, Esophageal stricture, COVID February 2022, Anxiety  Constitutional:  BP 122/76 (BP Location: Left Arm, Cuff Size: Normal)   Pulse 80   Temp (!) 97.5 F (36.4 C) (Oral)   Ht 5\' 9"  (1.753 m)   Wt 226 lb (102.5 kg)   SpO2 97% Comment: onRA  BMI 33.37 kg/m   Brief Summary:  Brian Maldonado is a 36 y.o. male with obstructive sleep apnea.      Subjective:   He was previously seen by Dr. Ashby Dawes.  Home sleep study from 2018 showed moderate sleep apnea.  He was started on auto CPAP.  He hasn't used CPAP in the past few months.  He was having more trouble with his breathing from nasal congestion.  He tried several different CPAP masks.  He wasn't able to keep the mask on and felt like the pressure never could be adjusted correctly.  He is still snoring, and his wife kicks him out of the bedroom because of this.  He will stop breathing at night.  He is very restless at night.  He will talk in his sleep sometimes.  He feels tired during the day.  He goes to sleep at 830 to 930 pm.  He falls asleep in a few minutes.  He wakes up some times to use the bathroom.  He gets out of bed at between 4 and 6 am.  He feels tired in the morning.  He denies morning headache.  He does not use anything to help him fall sleep.  He drinks coffee and pre-work out drinks several times per week.  He denies sleep walking, sleep talking, bruxism, or nightmares.  There is no history of restless legs.  He denies sleep hallucinations, sleep paralysis, or cataplexy.  He was seen by his  dentist recently for jaw clicking.  He was told that he could be a candidate for an oral appliance for sleep apnea.  The Epworth score is 19 out of 24.   Physical Exam:   Appearance - well kempt   ENMT - no sinus tenderness, no oral exudate, no LAN, Mallampati 3 airway, no stridor, overbite  Respiratory - equal breath sounds bilaterally, no wheezing or rales  CV - s1s2 regular rate and rhythm, no murmurs  Ext - no clubbing, no edema  Skin - no rashes  Psych - normal mood and affect   Sleep Tests:  HST 04/26/17 >> AHI 18.8, SpO2 low 83%  Social History:  He  reports that he has never smoked. He has never used smokeless tobacco. He reports that he does not drink alcohol and does not use drugs.  Family History:  His family history includes ADD / ADHD in his father; Diabetes in his father; Drug abuse in his father and mother; Heart disease in his paternal grandfather; Migraines in his mother; Prostate cancer in his paternal grandfather and paternal uncle; Sleep apnea in his father and mother.     Discussion:  He has history of moderate obstructive sleep apnea.  He has not been able to tolerate CPAP  therapy.  He has tried different mask fits and pressure settings.  He continues to have snoring, sleep disruption, apnea, and daytime sleepiness.  It is my medical opinion that a mandibular advancement device could be a suitable option for treatment of his obstructive sleep apnea.  Assessment/Plan:   Obstructive sleep apnea. - will arrange for referral to Dr. Augustina Mood to assess for a mandibular advancement device   Chronic rhinitis. - advised him to try using astepro in the morning and flonase at night - he will call with the name of nose spray that was previously prescribed by ENT  Time Spent Involved in Patient Care on Day of Examination:  38 minutes  Follow up:   Patient Instructions  Will arrange for a referral to Dr. Augustina Mood to get fitted for a mandibular  advancement device to treat obstructive sleep apnea.  Try using astepro in the morning and flonase at night.  Follow up in 6 months.  Medication List:   Allergies as of 03/25/2022       Reactions   Sulfa Antibiotics    Unknown;  Had as a child and does not recall reaction        Medication List        Accurate as of March 25, 2022 12:28 PM. If you have any questions, ask your nurse or doctor.          albuterol 108 (90 Base) MCG/ACT inhaler Commonly known as: VENTOLIN HFA Inhale 2 puffs into the lungs every 6 (six) hours as needed for wheezing or shortness of breath.   amphetamine-dextroamphetamine 15 MG tablet Commonly known as: Adderall Take 1 tablet by mouth 2 (two) times daily.   Azelastine HCl 0.15 % Soln Commonly known as: Astepro Place 1 spray into the nose every morning. Started by: Chesley Mires, MD   cyanocobalamin 1000 MCG tablet Commonly known as: VITAMIN B12 Take 1,000 mcg by mouth daily.   fluticasone 50 MCG/ACT nasal spray Commonly known as: FLONASE Place 1 spray into both nostrils at bedtime. Started by: Chesley Mires, MD   Glutamine 500 MG Tabs Take by mouth every morning.   multivitamin tablet Take 1 tablet by mouth daily.   OVER THE COUNTER MEDICATION Branch Chain Amino Acids - after workouts.   sertraline 100 MG tablet Commonly known as: Zoloft Take 1 tablet (100 mg total) by mouth daily.        Signature:  Chesley Mires, MD [place] Pager - [phone] 03/25/2022, 12:28 PM

## 2022-03-29 ENCOUNTER — Telehealth: Payer: Self-pay | Admitting: Pulmonary Disease

## 2022-03-30 NOTE — Telephone Encounter (Signed)
I spoke with Brian Maldonado at Dr. Corky Sing office and she confirmed that she has this referral and the sleep study that was included in the records. She stated that she works the [employer], [employer] and didn't know who would have called asking for the sleep study

## 2022-04-06 ENCOUNTER — Ambulatory Visit (INDEPENDENT_AMBULATORY_CARE_PROVIDER_SITE_OTHER): Payer: BC Managed Care – PPO | Admitting: Psychology

## 2022-04-06 DIAGNOSIS — F411 Generalized anxiety disorder: Secondary | ICD-10-CM | POA: Diagnosis not present

## 2022-04-06 NOTE — Progress Notes (Addendum)
Brian Maldonado/Therapist Progress Note  Patient ID: Brian Maldonado, MRN: [id number],    Date: 04/06/2022  Time Spent: 45 mins  Treatment Type: Individual Therapy  Reported Symptoms: Pt presented for a follow up session, via webex video.  Pt granted consent for session, stating that he is in his car with no one else present.  I shared with pt that I am in my office with no one else here either.    Mental Status Exam: Appearance:  Casual     Behavior: Appropriate  Motor: Normal  Speech/Language:  Clear and Coherent  Affect: Appropriate  Mood: normal  Thought process: normal  Thought content:   WNL  Sensory/Perceptual disturbances:   WNL  Orientation: oriented to person, place, and time/date  Attention: Good  Concentration: Good  Memory: WNL  Fund of knowledge:  Good  Insight:   Good  Judgment:  Good  Impulse Control: Good   Risk Assessment: Danger to Self:  No Self-injurious Behavior: No Danger to Others: No Duty to Warn:no Physical Aggression / Violence:No  Access to Firearms a concern: No  Gang Involvement:No   Subjective: Pt shares that he has "been doing pretty well since our last session.  It has been a pretty good few weeks.  There is still some stress at work.  My schedule was changed from 8am to 5pm with an hour lunch; I used to work 7am to 330pm with a 30 min lunch."  Pt shares that the change in schedule happened as a disciplinary decision but he was not written up.  They told him he was not starting work to at [employer] so he is having to work in the office at regular office hours.  Pt knows he is responsible for giving them a full days work and he will do that.  Pt shares that he thinks he is pretty good at leaving the stress of work at work; "some days I am better at it than other days."  Pt is trying not to take the stress of work home with him.  Pt shares that he continues to work out 3 days per week.  He shares that his nutrition is not as good  as it has been but he is trying to focus on that again and getting it back in order.  Pt shares that church has been going really well; "It is taking up a lot of time but it is all good."  Pt shares that it is fun for him but it is taking up a lot of his time.  Pt thinks he may be asked to become the [employer] and he is OK with it either way.  Pt shares he and Brian Maldonado are both enjoying the new church.  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  Pt shares they are trying to be smart with their money; they only owe about $40K on their home.  Pt shares that Brian Maldonado is doing really well in school; "I think she has ADHD but Brian Maldonado is being stubborn about it."  Pt would like for her to be evaluated and for them to work on behavioral ways to address it.  Pt also has ADHD and so he tends to be more understanding with Brian Maldonado about her issues.  Brian Maldonado is continuing to take dance and piano lessons during the week.  Pt and Brian Maldonado to continue to  try to get pregnant; she has been taking fertility medication as they did to get pregnant with Brian Maldonado.  They are both working together on it right now but pt is a little nervous about it right now.  Encouraged pt to be as intentional as possible about engaging in self care activities for himself (working out, reading, movies, or TV) and we will meet in 3 wks for a follow up session.  Interventions: Cognitive Behavioral Therapy  Diagnosis:Generalized anxiety disorder  Plan: Treatment Plan Strengths/Abilities:  Intelligent, Intuitive, Willing to participate in therapy Treatment Preferences:  Outpatient Individual Therapy Statement of Needs:  Patient is to use CBT, mindfulness and coping skills to help manage and/or decrease symptoms associated with their diagnosis. Symptoms:  Depressed/Irritable mood, worry, social withdrawal Problems Addressed:  Depressive  thoughts, Sadness, Sleep issues, etc. Long Term Goals:  Pt to reduce overall level, frequency, and intensity of the feelings of depression/anxiety as evidenced by decreased irritability, negative self talk, and helpless feelings from 6 to 7 days/week to 0 to 1 days/week, per client report, for at least 3 consecutive months.  Progress: 20% Short Term Goals:  Pt to verbally express understanding of the relationship between feelings of depression/anxiety and their impact on thinking patterns and behaviors.  Pt to verbalize an understanding of the role that distorted thinking plays in creating fears, excessive worry, and ruminations.  Progress: 20% Target Date:  06/12/2022 Frequency:  Bi-weekly Modality:  Cognitive Behavioral Therapy Interventions by Therapist:  Therapist will use CBT, Mindfulness exercises, Coping skills and Referrals, as needed by client. Client has verbally approved this treatment plan.  Brian Maldonado, [facility]

## 2022-04-13 ENCOUNTER — Telehealth: Payer: Self-pay | Admitting: Behavioral Health

## 2022-04-13 ENCOUNTER — Other Ambulatory Visit: Payer: Self-pay

## 2022-04-13 DIAGNOSIS — F902 Attention-deficit hyperactivity disorder, combined type: Secondary | ICD-10-CM

## 2022-04-13 DIAGNOSIS — G4733 Obstructive sleep apnea (adult) (pediatric): Secondary | ICD-10-CM | POA: Diagnosis not present

## 2022-04-13 MED ORDER — AMPHETAMINE-DEXTROAMPHETAMINE 15 MG PO TABS: 15.0000 mg | ORAL_TABLET | Freq: Two times a day (BID) | ORAL | 0 refills | Status: DC

## 2022-04-13 NOTE — Telephone Encounter (Signed)
Pt lvm that he needs a refill on his adderall 15 mg. [facility]. Next apt 10/25

## 2022-04-13 NOTE — Telephone Encounter (Signed)
Pended.

## 2022-04-20 ENCOUNTER — Encounter: Payer: Self-pay | Admitting: Behavioral Health

## 2022-04-20 ENCOUNTER — Telehealth: Payer: BC Managed Care – PPO | Admitting: Behavioral Health

## 2022-04-20 DIAGNOSIS — F902 Attention-deficit hyperactivity disorder, combined type: Secondary | ICD-10-CM | POA: Diagnosis not present

## 2022-04-20 DIAGNOSIS — F411 Generalized anxiety disorder: Secondary | ICD-10-CM | POA: Diagnosis not present

## 2022-04-20 DIAGNOSIS — F3289 Other specified depressive episodes: Secondary | ICD-10-CM | POA: Diagnosis not present

## 2022-04-20 NOTE — Progress Notes (Signed)
Brian Maldonado [id number] 1986/03/31 36 y.o.  Virtual Visit via Video Note  I connected with pt @ on 04/20/22 at  9:00 AM EDT by a video enabled telemedicine application and verified that I am speaking with the correct person using two identifiers.   I discussed the limitations of evaluation and management by telemedicine and the availability of in person appointments. The patient expressed understanding and agreed to proceed.  I discussed the assessment and treatment plan with the patient. The patient was provided an opportunity to ask questions and all were answered. The patient agreed with the plan and demonstrated an understanding of the instructions.   The patient was advised to call back or seek an in-person evaluation if the symptoms worsen or if the condition fails to improve as anticipated.  I provided 30 minutes of non-face-to-face time during this encounter.  The patient was located at home.  The provider was located at [facility].   Joan Flores, NP   Subjective:   Patient ID:  Brian Maldonado is a 36 y.o. (DOB 03-16-1986) male.  Chief Complaint:  Chief Complaint  Patient presents with   Anxiety   Follow-up   ADHD   Medication Refill   Patient Education    HPI  Brian Maldonado, 36 year old male presents to this office via video visit for follow up and medication management.  No changes this visit. He says that he continues to noticed significant improvement with anxiety and depression since last visit.  His hair pulling has improved to almost zero since using NAC supplement.  No more bald patches in beard. He does not want to change his medications at this time. Says his anxiety today is 1/10 and depression is 0/10. He is sleeping 7-8 hours per night. No mania, no psychosis, No SI/HI. Request f/u every 4 months.    No previous psychiatric medication failures     Review of Systems:  Review of Systems  Constitutional: Negative.   Neurological:  Negative.   Psychiatric/Behavioral: Negative.      Medications: I have reviewed the patient's current medications.  Current Outpatient Medications  Medication Sig Dispense Refill   albuterol (VENTOLIN HFA) 108 (90 Base) MCG/ACT inhaler Inhale 2 puffs into the lungs every 6 (six) hours as needed for wheezing or shortness of breath. 8 g 0   amphetamine-dextroamphetamine (ADDERALL) 15 MG tablet Take 1 tablet by mouth 2 (two) times daily. 60 tablet 0   [START ON 05/11/2022] amphetamine-dextroamphetamine (ADDERALL) 15 MG tablet Take 1 tablet by mouth 2 (two) times daily. 60 tablet 0   [START ON 06/08/2022] amphetamine-dextroamphetamine (ADDERALL) 15 MG tablet Take 1 tablet by mouth 2 (two) times daily. 60 tablet 0   Azelastine HCl (ASTEPRO) 0.15 % SOLN Place 1 spray into the nose every morning.     fluticasone (FLONASE) 50 MCG/ACT nasal spray Place 1 spray into both nostrils at bedtime. 16 g 2   Glutamine 500 MG TABS Take by mouth every morning.     Multiple Vitamin (MULTIVITAMIN) tablet Take 1 tablet by mouth daily.     OVER THE COUNTER MEDICATION Branch Chain Amino Acids - after workouts.     sertraline (ZOLOFT) 100 MG tablet Take 1 tablet (100 mg total) by mouth daily. 30 tablet 4   vitamin B-12 (CYANOCOBALAMIN) 1000 MCG tablet Take 1,000 mcg by mouth daily.     No current facility-administered medications for this visit.    Medication Side Effects: None  Allergies:  Allergies  Allergen Reactions   Sulfa Antibiotics     Unknown;  Had as a child and does not recall reaction    Past Medical History:  Diagnosis Date   Cervicogenic headache 07/01/2015   Left side   GERD (gastroesophageal reflux disease)    Headache    Left shoulder pain    S/P dilatation of esophageal stricture    Sleep apnea 2018    Family History  Problem Relation Age of Onset   Drug abuse Mother    Sleep apnea Mother    Migraines Mother    Drug abuse Father    ADD / ADHD Father    Diabetes Father     Sleep apnea Father    Prostate cancer Paternal Uncle    Heart disease Paternal Grandfather    Prostate cancer Paternal Grandfather    Colon cancer Neg Hx     Social History   Socioeconomic History   Marital status: Married    Spouse name: Not on file   Number of children: 1   Years of education: some coll.   Highest education level: Not on file  Occupational History   Occupation: [employer]-works from home   Occupation: Advertising copywriter: Teacher, music  Tobacco Use   Smoking status: Never   Smokeless tobacco: Never  Vaping Use   Vaping Use: Never used  Substance and Sexual Activity   Alcohol use: No   Drug use: No   Sexual activity: Yes  Other Topics Concern   Not on file  Social History Narrative   Patient drinks 1 cup of coffee/caffeine daily.   Patient is left handed.    Married 2012   Daughter [name] born 2017   From [place].  Working for [employer].     Enjoys playing bass guitar.     Social Determinants of Health   Financial Resource Strain: Not on file  Food Insecurity: Not on file  Transportation Needs: Not on file  Physical Activity: Not on file  Stress: Not on file  Social Connections: Not on file  Intimate Partner Violence: Not on file    Past Medical History, Surgical history, Social history, and Family history were reviewed and updated as appropriate.   Please see review of systems for further details on the patient's review from today.   Objective:   Physical Exam:  There were no vitals taken for this visit.  Physical Exam Constitutional:      General: He is not in acute distress.    Appearance: Normal appearance.  Neurological:     Mental Status: He is alert and oriented to person, place, and time.     Gait: Gait normal.  Psychiatric:        Attention and Perception: Attention and perception normal. He does not perceive auditory or visual hallucinations.        Mood and Affect: Mood and  affect normal. Mood is not anxious or depressed. Affect is not labile.        Speech: Speech normal.        Behavior: Behavior normal. Behavior is cooperative.        Thought Content: Thought content normal.        Cognition and Memory: Cognition and memory normal.        Judgment: Judgment normal.     Lab Review:     Component Value Date/Time   NA 139 02/03/2022 1558   NA 140 04/07/2013 1842  K 4.0 02/03/2022 1558   K 4.0 04/07/2013 1842   CL 99 02/03/2022 1558   CL 108 (H) 04/07/2013 1842   CO2 25 02/03/2022 1558   CO2 28 04/07/2013 1842   GLUCOSE 107 (H) 02/03/2022 1558   GLUCOSE 140 (H) 04/07/2013 1842   BUN 30 (H) 02/03/2022 1558   BUN 15 04/07/2013 1842   CREATININE 1.07 02/03/2022 1558   CREATININE 1.14 04/07/2013 1842   CALCIUM 9.3 02/03/2022 1558   CALCIUM 9.9 04/07/2013 1842   GFRNONAA >60 07/10/2018 2033   GFRNONAA >60 04/07/2013 1842   GFRAA >60 07/10/2018 2033   GFRAA >60 04/07/2013 1842       Component Value Date/Time   WBC 5.1 02/03/2022 1558   RBC 4.54 02/03/2022 1558   HGB 14.2 02/03/2022 1558   HGB 15.1 04/07/2013 1842   HCT 40.9 02/03/2022 1558   HCT 42.6 04/07/2013 1842   PLT 186.0 02/03/2022 1558   PLT 186 04/07/2013 1842   MCV 90.0 02/03/2022 1558   MCV 90 04/07/2013 1842   MCH 31.4 07/10/2018 2033   MCHC 34.7 02/03/2022 1558   RDW 12.4 02/03/2022 1558   RDW 12.4 04/07/2013 1842   LYMPHSABS 1.2 02/03/2022 1558   MONOABS 0.5 02/03/2022 1558   EOSABS 0.2 02/03/2022 1558   BASOSABS 0.0 02/03/2022 1558    No results found for: "POCLITH", "LITHIUM"   No results found for: "PHENYTOIN", "PHENOBARB", "VALPROATE", "CBMZ"   .res Assessment: Plan:    Greater than 50% of  30 min face to face time with patient was spent on counseling and coordination of care. We discussed his continued significant improvement  with anxiety and depression. He is very pleased with his progress and medication and is happy with his medications.  Reports a 90%   improvement with hair pulling. He does not want to adjust medications at this time. He is continuing in psychotherapy monthly.    We agreed to:   To Continue Zoloft to 100 mg daily Continue NAC 3600 mg daily To take with a little food to help with any nausea. Will report side effects or worsening symptoms To follow up in 6 months to reassess per pt. Provided emergency contact information Reviewed PDMP     Higinio was seen today for anxiety, follow-up, adhd, medication refill and patient education.  Diagnoses and all orders for this visit:  Generalized anxiety disorder  Attention deficit hyperactivity disorder (ADHD), combined type  Other depression     Please see After Visit Summary for patient specific instructions.  Future Appointments  Date Time Provider Department Center  04/28/2022 10:00 AM Karie Kirks [facility] None  08/16/2022  3:30 PM [name] A, NP [facility] None    No orders of the defined types were placed in this encounter.     -------------------------------

## 2022-04-28 ENCOUNTER — Ambulatory Visit: Payer: BC Managed Care – PPO | Admitting: Psychology

## 2022-04-29 ENCOUNTER — Ambulatory Visit (INDEPENDENT_AMBULATORY_CARE_PROVIDER_SITE_OTHER): Payer: BC Managed Care – PPO | Admitting: Psychology

## 2022-04-29 DIAGNOSIS — F411 Generalized anxiety disorder: Secondary | ICD-10-CM

## 2022-04-29 NOTE — Progress Notes (Signed)
[facility] Counselor/Therapist Progress Note  Patient ID: KAZUTO SEVEY, MRN: [id number],    Date: 04/29/2022  Time Spent: 45 mins  Treatment Type: Individual Therapy  Reported Symptoms: Pt presented for a follow up session, via webex video.  Pt granted consent for session, stating that he is in his car with no one else present.  I shared with pt that I am in my office with no one else here either.    Mental Status Exam: Appearance:  Casual     Behavior: Appropriate  Motor: Normal  Speech/Language:  Clear and Coherent  Affect: Appropriate  Mood: normal  Thought process: normal  Thought content:   WNL  Sensory/Perceptual disturbances:   WNL  Orientation: oriented to person, place, and time/date  Attention: Good  Concentration: Good  Memory: WNL  Fund of knowledge:  Good  Insight:   Good  Judgment:  Good  Impulse Control: Good   Risk Assessment: Danger to Self:  No Self-injurious Behavior: No Danger to Others: No Duty to Warn:no Physical Aggression / Violence:No  Access to Firearms a concern: No  Gang Involvement:No   Subjective: Pt shares that he has "been doing pretty good since our last session.  There are little updates here and there but things are moving along pretty well."  Pt and [name] dressed up as [place] and Jasmine.  Pt shares that the only update is that [name] told them she wanted to start sleeping in her own room.  She has been suggesting it for the past couple of weeks and they finally agreed to it last night.  Pt shares his sleep apnea is still not in check; he has ordered an appliance from his dentist to help with this and is waiting for it to come in.  Pt shares that it is a bigger adjustment for Br[place] than for pt.  Pt shares that [name] got up this morning and made her bed and pt fixed her breakfast this morning.  Pt is not sure why [name] chose to make this change but he believes it is the best thing.  Pt shares that he continues to work his  8am to 5pm schedule at work; he has still not had any disciplinary action for work.  Pt shares he continues to be successful leaving work at work.  He is still having some difficulty getting going in the mornings, probably because of his sleep apnea.  He has tried a CPAP machine but has not been able to get used to it.  Pt shares that he is still enjoying the new church and serving as the [employer].  Pt continues to try to work out as frequently as is good for him, especially since he is not sleeping well right now.  Pt shares that he wants to talk about his parents a bit this session.  Pt shares that his paternal great aunt reached out to pt via FB Messenger to tell him that his dad is in the [facility] with [facility] and was in the ICU for a while.; she also mentioned that it would be good for his parents to see [name] more than they currently do.  Pt shares he was direct with his great aunt that she should not talk about things that she did not really know about or understand.  He was frustrated about this interaction.  He tried to text his mom on her birthday recently and did not hear back from her.  He has not talked with  his dad since hearing from his great aunt.  Pt shares he does think about reaching out to his dad every now and then but then chooses not to reach out to him.  He does not like to listen to his dad's complaining about his health.  [name] is supportive of pt doing whatever he thinks is best for him and his parents.  Pt's sister is currently living with their parents and pt is somewhat estranged from his sister.  His sister has accused pt of being a bad son to their parents.  This sister is actually his cousin and grew up in pt's home because her parents were both drug/alcohol addicts.  Pt shares that he may think about reaching out to his dad this evening on the way home from work.  Pt and [name] are still working on trying to get pregnant and have a schedule that they work on, given her  cycles.  Encouraged pt to be as intentional as possible about engaging in self care activities for himself (working out, reading, movies, or TV) and we will meet in 3 wks for a follow up session.  Interventions: Cognitive Behavioral Therapy  Diagnosis:Generalized anxiety disorder  Plan: Treatment Plan Strengths/Abilities:  Intelligent, Intuitive, Willing to participate in therapy Treatment Preferences:  Outpatient Individual Therapy Statement of Needs:  Patient is to use CBT, mindfulness and coping skills to help manage and/or decrease symptoms associated with their diagnosis. Symptoms:  Depressed/Irritable mood, worry, social withdrawal Problems Addressed:  Depressive thoughts, Sadness, Sleep issues, etc. Long Term Goals:  Pt to reduce overall level, frequency, and intensity of the feelings of depression/anxiety as evidenced by decreased irritability, negative self talk, and helpless feelings from 6 to 7 days/week to 0 to 1 days/week, per client report, for at least 3 consecutive months.  Progress: 20% Short Term Goals:  Pt to verbally express understanding of the relationship between feelings of depression/anxiety and their impact on thinking patterns and behaviors.  Pt to verbalize an understanding of the role that distorted thinking plays in creating fears, excessive worry, and ruminations.  Progress: 20% Target Date:  06/12/2022 Frequency:  Bi-weekly Modality:  Cognitive Behavioral Therapy Interventions by Therapist:  Therapist will use CBT, Mindfulness exercises, Coping skills and Referrals, as needed by client. Client has verbally approved this treatment plan.  Ivan Anchors, [facility]

## 2022-05-13 ENCOUNTER — Telehealth: Payer: Self-pay | Admitting: Behavioral Health

## 2022-05-13 ENCOUNTER — Other Ambulatory Visit: Payer: Self-pay

## 2022-05-13 NOTE — Telephone Encounter (Signed)
Patient already has refills for November and December at the pharmacy. Notified him.

## 2022-05-13 NOTE — Telephone Encounter (Signed)
Next visit is 08/16/22. Requesting refill on Adderall 15 mg called to:  [facility] - [place], [place] - [address]   Phone: [phone]  Fax: [phone]    Per patient it's in stock

## 2022-05-19 ENCOUNTER — Ambulatory Visit (INDEPENDENT_AMBULATORY_CARE_PROVIDER_SITE_OTHER): Payer: BC Managed Care – PPO | Admitting: Psychology

## 2022-05-19 DIAGNOSIS — F411 Generalized anxiety disorder: Secondary | ICD-10-CM | POA: Diagnosis not present

## 2022-05-19 NOTE — Progress Notes (Signed)
[facility] Counselor/Therapist Progress Note  Patient ID: DANTE ROUDEBUSH, MRN: [id number],    Date: 05/19/2022  Time Spent: 30 mins  Treatment Type: Individual Therapy  Reported Symptoms: Pt presented for a follow up session, via webex video.  Pt granted consent for session, stating that he is in his car with no one else present.  I shared with pt that I am in my office with no one else here either.    Mental Status Exam: Appearance:  Casual     Behavior: Appropriate  Motor: Normal  Speech/Language:  Clear and Coherent  Affect: Appropriate  Mood: normal  Thought process: normal  Thought content:   WNL  Sensory/Perceptual disturbances:   WNL  Orientation: oriented to person, place, and time/date  Attention: Good  Concentration: Good  Memory: WNL  Fund of knowledge:  Good  Insight:   Good  Judgment:  Good  Impulse Control: Good   Risk Assessment: Danger to Self:  No Self-injurious Behavior: No Danger to Others: No Duty to Warn:no Physical Aggression / Violence:No  Access to Firearms a concern: No  Gang Involvement:No   Subjective: Pt shares that he has "been doing pretty good since our last session.  Everything at home has been great; work has not been bad but it has it's frustrations.  Things are going pretty good.  We are starting round 3 or 4 of trying to make a baby and things are going well in that aspect.  [name] seems to be more willing to engage with the process; it has been more romantic this time than in the past.  I have not been to the gym since our last session; I have not been mentally into working out recently."  Pt shares that he has chosen not to feel bad about not going to the gym; he is trying to give himself grace in this area.  He has been focusing more on spending time with [name] and [name] and also focusing on church as well.  Pt shares that "work is work; I am doing a good job but I am just kind of over it."  [name] is alternating  between her room and pt's and Meredith's room.  "She has little spats of independence and then she will misses mommy and daddy."  He did get his sleep appliance from the dentist and feels like he has been sleeping better.  No one has been complaining about his snoring lately.  He is planning to have a sleep study at home in the next few weeks.  He has a follow up appt with his dentist soon as well.  Pt shares they are having Thanksgiving with Meredith's family and he will see his aunt and his side of the family.  Pt continues to lead worship at church and he feels like things there are going well; there are some stressors there but he is enjoying it.  Encouraged pt to be as intentional as possible about engaging in self care activities for himself (working out, reading, movies, or TV) and we will meet in 3 wks for a follow up session.  Interventions: Cognitive Behavioral Therapy  Diagnosis:Generalized anxiety disorder  Plan: Treatment Plan Strengths/Abilities:  Intelligent, Intuitive, Willing to participate in therapy Treatment Preferences:  Outpatient Individual Therapy Statement of Needs:  Patient is to use CBT, mindfulness and coping skills to help manage and/or decrease symptoms associated with their diagnosis. Symptoms:  Depressed/Irritable mood, worry, social withdrawal Problems Addressed:  Depressive thoughts, Sadness, Sleep issues,  etc. Long Term Goals:  Pt to reduce overall level, frequency, and intensity of the feelings of depression/anxiety as evidenced by decreased irritability, negative self talk, and helpless feelings from 6 to 7 days/week to 0 to 1 days/week, per client report, for at least 3 consecutive months.  Progress: 20% Short Term Goals:  Pt to verbally express understanding of the relationship between feelings of depression/anxiety and their impact on thinking patterns and behaviors.  Pt to verbalize an understanding of the role that distorted thinking plays in creating fears,  excessive worry, and ruminations.  Progress: 20% Target Date:  06/12/2022 Frequency:  Bi-weekly Modality:  Cognitive Behavioral Therapy Interventions by Therapist:  Therapist will use CBT, Mindfulness exercises, Coping skills and Referrals, as needed by client. Client has verbally approved this treatment plan.  Karie Kirks, [facility]

## 2022-06-07 DIAGNOSIS — Z3141 Encounter for fertility testing: Secondary | ICD-10-CM | POA: Diagnosis not present

## 2022-06-08 ENCOUNTER — Telehealth: Payer: BC Managed Care – PPO | Admitting: Physician Assistant

## 2022-06-08 ENCOUNTER — Encounter: Payer: Self-pay | Admitting: Physician Assistant

## 2022-06-08 DIAGNOSIS — R6889 Other general symptoms and signs: Secondary | ICD-10-CM | POA: Diagnosis not present

## 2022-06-08 MED ORDER — BENZONATATE 100 MG PO CAPS: 100.0000 mg | ORAL_CAPSULE | Freq: Three times a day (TID) | ORAL | 0 refills | Status: AC | PRN

## 2022-06-08 MED ORDER — PROMETHAZINE-DM 6.25-15 MG/5ML PO SYRP: 5.0000 mL | ORAL_SOLUTION | Freq: Four times a day (QID) | ORAL | 0 refills | Status: AC | PRN

## 2022-06-08 MED ORDER — OSELTAMIVIR PHOSPHATE 75 MG PO CAPS: 75.0000 mg | ORAL_CAPSULE | Freq: Two times a day (BID) | ORAL | 0 refills | Status: DC

## 2022-06-08 NOTE — Progress Notes (Signed)
Virtual Visit Consent   Brian Maldonado, you are scheduled for a virtual visit with a [facility] provider today. Just as with appointments in the office, your consent must be obtained to participate. Your consent will be active for this visit and any virtual visit you may have with one of our providers in the next 365 days. If you have a MyChart account, a copy of this consent can be sent to you electronically.  As this is a virtual visit, video technology does not allow for your provider to perform a traditional examination. This may limit your provider's ability to fully assess your condition. If your provider identifies any concerns that need to be evaluated in person or the need to arrange testing (such as labs, EKG, etc.), we will make arrangements to do so. Although advances in technology are sophisticated, we cannot ensure that it will always work on either your end or our end. If the connection with a video visit is poor, the visit may have to be switched to a telephone visit. With either a video or telephone visit, we are not always able to ensure that we have a secure connection.  By engaging in this virtual visit, you consent to the provision of healthcare and authorize for your insurance to be billed (if applicable) for the services provided during this visit. Depending on your insurance coverage, you may receive a charge related to this service.  I need to obtain your verbal consent now. Are you willing to proceed with your visit today? Brian Maldonado has provided verbal consent on 06/08/2022 for a virtual visit (video or telephone). Margaretann Loveless, PA-C  Date: 06/08/2022 9:03 AM  Virtual Visit via Video Note   I, Margaretann Loveless, connected with  Brian Maldonado  ([id number], 1985-12-17) on 06/08/22 at  8:15 AM EST by a video-enabled telemedicine application and verified that I am speaking with the correct person using two identifiers.  Location: Patient: Virtual Visit  Location Patient: Home Provider: Virtual Visit Location Provider: Home Office   I discussed the limitations of evaluation and management by telemedicine and the availability of in person appointments. The patient expressed understanding and agreed to proceed.    History of Present Illness: Brian Maldonado is a 36 y.o. who identifies as a male who was assigned male at birth, and is being seen today for flu-like symptoms.  HPI: URI  This is a new problem. The current episode started yesterday. The problem has been gradually worsening. Maximum temperature: 99.9 fever last night. Associated symptoms include congestion, coughing, headaches and rhinorrhea. Pertinent negatives include no ear pain or plugged ear sensation. Associated symptoms comments: Fatigue, body aches. Treatments tried: nyquil.    Covid 19 at home testing is negative.  Problems:  Patient Active Problem List   Diagnosis Date Noted   OSA (obstructive sleep apnea) 02/10/2022   TMJ click 08/24/2020   Raynaud's phenomenon 08/24/2020   Attention deficit disorder 06/13/2019   FH: prostate cancer 06/12/2019   Routine general medical examination at a health care facility 01/17/2017   Cold sore 01/17/2017   S/P dilatation of esophageal stricture 06/02/2016   Carpal tunnel syndrome 06/02/2016   Advance care planning 06/01/2016    Allergies:  Allergies  Allergen Reactions   Sulfa Antibiotics     Unknown;  Had as a child and does not recall reaction   Medications:  Current Outpatient Medications:    benzonatate (TESSALON) 100 MG capsule, Take 1 capsule (100 mg total)  by mouth 3 (three) times daily as needed., Disp: 30 capsule, Rfl: 0   oseltamivir (TAMIFLU) 75 MG capsule, Take 1 capsule (75 mg total) by mouth 2 (two) times daily., Disp: 10 capsule, Rfl: 0   promethazine-dextromethorphan (PROMETHAZINE-DM) 6.25-15 MG/5ML syrup, Take 5 mLs by mouth 4 (four) times daily as needed., Disp: 118 mL, Rfl: 0   albuterol (VENTOLIN HFA) 108  (90 Base) MCG/ACT inhaler, Inhale 2 puffs into the lungs every 6 (six) hours as needed for wheezing or shortness of breath., Disp: 8 g, Rfl: 0   amphetamine-dextroamphetamine (ADDERALL) 15 MG tablet, Take 1 tablet by mouth 2 (two) times daily., Disp: 60 tablet, Rfl: 0   amphetamine-dextroamphetamine (ADDERALL) 15 MG tablet, Take 1 tablet by mouth 2 (two) times daily., Disp: 60 tablet, Rfl: 0   amphetamine-dextroamphetamine (ADDERALL) 15 MG tablet, Take 1 tablet by mouth 2 (two) times daily., Disp: 60 tablet, Rfl: 0   Azelastine HCl (ASTEPRO) 0.15 % SOLN, Place 1 spray into the nose every morning., Disp: , Rfl:    fluticasone (FLONASE) 50 MCG/ACT nasal spray, Place 1 spray into both nostrils at bedtime., Disp: 16 g, Rfl: 2   Glutamine 500 MG TABS, Take by mouth every morning., Disp: , Rfl:    Multiple Vitamin (MULTIVITAMIN) tablet, Take 1 tablet by mouth daily., Disp: , Rfl:    OVER THE COUNTER MEDICATION, Branch Chain Amino Acids - after workouts., Disp: , Rfl:    sertraline (ZOLOFT) 100 MG tablet, Take 1 tablet (100 mg total) by mouth daily., Disp: 30 tablet, Rfl: 4   vitamin B-12 (CYANOCOBALAMIN) 1000 MCG tablet, Take 1,000 mcg by mouth daily., Disp: , Rfl:   Observations/Objective: Patient is well-developed, well-nourished in no acute distress.  Resting comfortably at home.  Head is normocephalic, atraumatic.  No labored breathing.  Speech is clear and coherent with logical content.  Patient is alert and oriented at baseline.    Assessment and Plan: 1. Flu-like symptoms - benzonatate (TESSALON) 100 MG capsule; Take 1 capsule (100 mg total) by mouth 3 (three) times daily as needed.  Dispense: 30 capsule; Refill: 0 - promethazine-dextromethorphan (PROMETHAZINE-DM) 6.25-15 MG/5ML syrup; Take 5 mLs by mouth 4 (four) times daily as needed.  Dispense: 118 mL; Refill: 0 - oseltamivir (TAMIFLU) 75 MG capsule; Take 1 capsule (75 mg total) by mouth 2 (two) times daily.  Dispense: 10 capsule;  Refill: 0  - Suspect influenza due to symptoms and positive exposure - Tamiflu prescribed - Tessalon perles for cough - Continue OTC medication of choice for symptomatic management - Push fluids - Rest - Seek in person evaluation if symptoms worsen or fail to improve   Follow Up Instructions: I discussed the assessment and treatment plan with the patient. The patient was provided an opportunity to ask questions and all were answered. The patient agreed with the plan and demonstrated an understanding of the instructions.  A copy of instructions were sent to the patient via MyChart unless otherwise noted below.    The patient was advised to call back or seek an in-person evaluation if the symptoms worsen or if the condition fails to improve as anticipated.  Time:  I spent 15 minutes with the patient via telehealth technology discussing the above problems/concerns.    Margaretann Loveless, PA-C

## 2022-06-08 NOTE — Patient Instructions (Signed)
Shelly Bombard, thank you for joining Margaretann Loveless, PA-C for today's virtual visit.  While this provider is not your primary care provider (PCP), if your PCP is located in our provider database this encounter information will be shared with them immediately following your visit.   A [facility] MyChart account gives you access to today's visit and all your visits, tests, and labs performed at [facility] " click here if you don't have a [facility] MyChart account or go to [email or web address]  Consent: (Patient) Brian Maldonado provided verbal consent for this virtual visit at the beginning of the encounter.  Current Medications:  Current Outpatient Medications:    benzonatate (TESSALON) 100 MG capsule, Take 1 capsule (100 mg total) by mouth 3 (three) times daily as needed., Disp: 30 capsule, Rfl: 0   oseltamivir (TAMIFLU) 75 MG capsule, Take 1 capsule (75 mg total) by mouth 2 (two) times daily., Disp: 10 capsule, Rfl: 0   promethazine-dextromethorphan (PROMETHAZINE-DM) 6.25-15 MG/5ML syrup, Take 5 mLs by mouth 4 (four) times daily as needed., Disp: 118 mL, Rfl: 0   albuterol (VENTOLIN HFA) 108 (90 Base) MCG/ACT inhaler, Inhale 2 puffs into the lungs every 6 (six) hours as needed for wheezing or shortness of breath., Disp: 8 g, Rfl: 0   amphetamine-dextroamphetamine (ADDERALL) 15 MG tablet, Take 1 tablet by mouth 2 (two) times daily., Disp: 60 tablet, Rfl: 0   amphetamine-dextroamphetamine (ADDERALL) 15 MG tablet, Take 1 tablet by mouth 2 (two) times daily., Disp: 60 tablet, Rfl: 0   amphetamine-dextroamphetamine (ADDERALL) 15 MG tablet, Take 1 tablet by mouth 2 (two) times daily., Disp: 60 tablet, Rfl: 0   Azelastine HCl (ASTEPRO) 0.15 % SOLN, Place 1 spray into the nose every morning., Disp: , Rfl:    fluticasone (FLONASE) 50 MCG/ACT nasal spray, Place 1 spray into both nostrils at bedtime., Disp: 16 g, Rfl: 2   Glutamine 500 MG TABS, Take by mouth every morning.,  Disp: , Rfl:    Multiple Vitamin (MULTIVITAMIN) tablet, Take 1 tablet by mouth daily., Disp: , Rfl:    OVER THE COUNTER MEDICATION, Branch Chain Amino Acids - after workouts., Disp: , Rfl:    sertraline (ZOLOFT) 100 MG tablet, Take 1 tablet (100 mg total) by mouth daily., Disp: 30 tablet, Rfl: 4   vitamin B-12 (CYANOCOBALAMIN) 1000 MCG tablet, Take 1,000 mcg by mouth daily., Disp: , Rfl:    Medications ordered in this encounter:  Meds ordered this encounter  Medications   benzonatate (TESSALON) 100 MG capsule    Sig: Take 1 capsule (100 mg total) by mouth 3 (three) times daily as needed.    Dispense:  30 capsule    Refill:  0    Order Specific Question:   Supervising Provider    Answer:   Merrilee Jansky [id number]   promethazine-dextromethorphan (PROMETHAZINE-DM) 6.25-15 MG/5ML syrup    Sig: Take 5 mLs by mouth 4 (four) times daily as needed.    Dispense:  118 mL    Refill:  0    Order Specific Question:   Supervising Provider    Answer:   Merrilee Jansky [id number]   oseltamivir (TAMIFLU) 75 MG capsule    Sig: Take 1 capsule (75 mg total) by mouth 2 (two) times daily.    Dispense:  10 capsule    Refill:  0    Order Specific Question:   Supervising Provider    Answer:   Merrilee Jansky [id number]     *If  you need refills on other medications prior to your next appointment, please contact your pharmacy*  Follow-Up: Call back or seek an in-person evaluation if the symptoms worsen or if the condition fails to improve as anticipated.  [facility] [phone]  Other Instructions Influenza, Adult Influenza, also called "the flu," is a viral infection that mainly affects the respiratory tract. This includes the lungs, nose, and throat. The flu spreads easily from person to person (is contagious). It causes common cold symptoms, along with high fever and body aches. What are the causes? This condition is caused by the influenza virus. You can get the virus  by: Breathing in droplets that are in the air from an infected person's cough or sneeze. Touching something that has the virus on it (has been contaminated) and then touching your mouth, nose, or eyes. What increases the risk? The following factors may make you more likely to get the flu: Not washing or sanitizing your hands often. Having close contact with many people during cold and flu season. Touching your mouth, eyes, or nose without first washing or sanitizing your hands. Not getting an annual flu shot. You may have a higher risk for the flu, including serious problems, such as a lung infection (pneumonia), if you: Are older than 65. Are pregnant. Have a weakened disease-fighting system (immune system). This includes people who have HIV or AIDS, are on chemotherapy, or are taking medicines that reduce (suppress) the immune system. Have a long-term (chronic) illness, such as heart disease, kidney disease, diabetes, or lung disease. Have a liver disorder. Are severely overweight (morbidly obese). Have anemia. Have asthma. What are the signs or symptoms? Symptoms of this condition usually begin suddenly and last 4-14 days. These may include: Fever and chills. Headaches, body aches, or muscle aches. Sore throat. Cough. Runny or stuffy (congested) nose. Chest discomfort. Poor appetite. Weakness or fatigue. Dizziness. Nausea or vomiting. How is this diagnosed? This condition may be diagnosed based on: Your symptoms and medical history. A physical exam. Swabbing your nose or throat and testing the fluid for the influenza virus. How is this treated? If the flu is diagnosed early, you can be treated with antiviral medicine that is given by mouth (orally) or through an IV. This can help reduce how severe the illness is and how long it lasts. Taking care of yourself at home can help relieve symptoms. Your health care provider may recommend: Taking over-the-counter  medicines. Drinking plenty of fluids. In many cases, the flu goes away on its own. If you have severe symptoms or complications, you may be treated in a hospital. Follow these instructions at home: Activity Rest as needed and get plenty of sleep. Stay home from work or school as told by your health care provider. Unless you are visiting your health care provider, avoid leaving home until your fever has been gone for 24 hours without taking medicine. Eating and drinking Take an oral rehydration solution (ORS). This is a drink that is sold at pharmacies and retail stores. Drink enough fluid to keep your urine pale yellow. Drink clear fluids in small amounts as you are able. Clear fluids include water, ice chips, fruit juice mixed with water, and low-calorie sports drinks. Eat bland, easy-to-digest foods in small amounts as you are able. These foods include bananas, applesauce, rice, lean meats, toast, and crackers. Avoid drinking fluids that contain a lot of sugar or caffeine, such as energy drinks, regular sports drinks, and soda. Avoid alcohol. Avoid  spicy or fatty foods. General instructions     Take over-the-counter and prescription medicines only as told by your health care provider. Use a cool mist humidifier to add humidity to the air in your home. This can make it easier to breathe. When using a cool mist humidifier, clean it daily. Empty the water and replace it with clean water. Cover your mouth and nose when you cough or sneeze. Wash your hands with soap and water often and for at least 20 seconds, especially after you cough or sneeze. If soap and water are not available, use alcohol-based hand sanitizer. Keep all follow-up visits. This is important. How is this prevented?  Get an annual flu shot. This is usually available in late summer, fall, or winter. Ask your health care provider when you should get your flu shot. Avoid contact with people who are sick during cold and flu  season. This is generally fall and winter. Contact a health care provider if: You develop new symptoms. You have: Chest pain. Diarrhea. A fever. Your cough gets worse. You produce more mucus. You feel nauseous or you vomit. Get help right away if you: Develop shortness of breath or have difficulty breathing. Have skin or nails that turn a bluish color. Have severe pain or stiffness in your neck. Develop a sudden headache or sudden pain in your face or ear. Cannot eat or drink without vomiting. These symptoms may represent a serious problem that is an emergency. Do not wait to see if the symptoms will go away. Get medical help right away. Call your local emergency services (911 in the [place] Do not drive yourself to the hospital. Summary Influenza, also called "the flu," is a viral infection that primarily affects your respiratory tract. Symptoms of the flu usually begin suddenly and last 4-14 days. Getting an annual flu shot is the best way to prevent getting the flu. Stay home from work or school as told by your health care provider. Unless you are visiting your health care provider, avoid leaving home until your fever has been gone for 24 hours without taking medicine. Keep all follow-up visits. This is important. This information is not intended to replace advice given to you by your health care provider. Make sure you discuss any questions you have with your health care provider. Document Revised: 02/01/2020 Document Reviewed: 02/01/2020 Elsevier Patient Education  2023 Elsevier Inc.    If you have been instructed to have an in-person evaluation today at a local Urgent Care facility, please use the link below. It will take you to a list of all of our available [facility], including address, phone number and hours of operation. Please do not delay care.  [facility]  If you or a family member do not have a primary care provider, use the link below to  schedule a visit and establish care. When you choose a [facility] primary care physician or advanced practice provider, you gain a long-term partner in health. Find a Primary Care Provider  Learn more about [facility]'s in-office and virtual care options: [facility] - Get Care Now

## 2022-06-09 ENCOUNTER — Ambulatory Visit: Payer: BC Managed Care – PPO | Admitting: Psychology

## 2022-06-30 ENCOUNTER — Ambulatory Visit (INDEPENDENT_AMBULATORY_CARE_PROVIDER_SITE_OTHER): Payer: BC Managed Care – PPO | Admitting: Psychology

## 2022-06-30 DIAGNOSIS — F411 Generalized anxiety disorder: Secondary | ICD-10-CM | POA: Diagnosis not present

## 2022-06-30 NOTE — Progress Notes (Addendum)
[facility] Counselor/Therapist Progress Note  Patient ID: KEBRON PULSE, MRN: [id number],    Date: 06/30/2022  Time Spent: 45 mins  Treatment Type: Individual Therapy  Reported Symptoms: Pt presented for a follow up session, via webex video.  Pt granted consent for session, stating that he is in his car with no one else present.  I shared with pt that I am in my office with no one else here either.    Mental Status Exam: Appearance:  Casual     Behavior: Appropriate  Motor: Normal  Speech/Language:  Clear and Coherent  Affect: Appropriate  Mood: normal  Thought process: normal  Thought content:   WNL  Sensory/Perceptual disturbances:   WNL  Orientation: oriented to person, place, and time/date  Attention: Good  Concentration: Good  Memory: WNL  Fund of knowledge:  Good  Insight:   Good  Judgment:  Good  Impulse Control: Good   Risk Assessment: Danger to Self:  No Self-injurious Behavior: No Danger to Others: No Duty to Warn:no Physical Aggression / Violence:No  Access to Firearms a concern: No  Gang Involvement:No   Subjective: Pt shares that he has "been doing pretty good since our last session.  Thanksgiving and Christmas were good."  Pt shares that he, [name], and [name] have been sick with different illnesses, but they are all fine now.  Pt shares, "work is work; nothing is terrible but nothing is great."  Pt shares that his dad passed away the 11/01/22 after Thanksgiving; "there was a lot to process there."  Pt shares that he did not get teary; "I wasn't sad or mad; I was regretful that things couldn't have been different than they were for my dad and I."  Pt was asked to speak at the funeral; [name] was not sure it was a good idea for him to do.  Pt prepared his talk and delivered it in a way "that I think I honored him as much as I could."  Pt shares he has a biological half-sister (father's daughter conceived when he was in [employer] in [place]) in [place]  and a "sister" (cousin) who lives with his parents.  Pt shares his relationship with his mom is distant right now.  Pt shares his dad was cremated and his mom wanted to spread his ashes at the beach; pt told her he did not want her to that.  He told her he wanted to keep the ashes or to keep the flag the family was given for his [employer] service.  His mom got upset with him about this.  Pt shares there is some relief for him at his dad's passing, both for his father no longer suffering and that pt does not have to deal with his dad's issues anymore.  Encouraged pt to feel however he needs to feel about the loss of his father.  Pt is not sure whether he plans to reach out to his mom or if he is going to wait for her to reach out to him.  She has worked 3rd shift for years.  Pt shares that his mom has a history of being manipulative with pt.  Encouraged pt to think about connecting with his mom as though they have no past; just talk with her about how she is doing now.  Pt shares that he and Hilda are going to [place] in 3 wks for vacation and we will meet again for a follow up session on 2/7,  after they return.  Encouraged pt to continue with his self care activities until our follow up session.  Interventions: Cognitive Behavioral Therapy  Diagnosis:Generalized anxiety disorder  Plan: Treatment Plan Strengths/Abilities:  Intelligent, Intuitive, Willing to participate in therapy Treatment Preferences:  Outpatient Individual Therapy Statement of Needs:  Patient is to use CBT, mindfulness and coping skills to help manage and/or decrease symptoms associated with their diagnosis. Symptoms:  Depressed/Irritable mood, worry, social withdrawal Problems Addressed:  Depressive thoughts, Sadness, Sleep issues, etc. Long Term Goals:  Pt to reduce overall level, frequency, and intensity of the feelings of depression/anxiety as evidenced by decreased irritability, negative self talk, and helpless feelings  from 6 to 7 days/week to 0 to 1 days/week, per client report, for at least 3 consecutive months.  Progress: 20% Short Term Goals:  Pt to verbally express understanding of the relationship between feelings of depression/anxiety and their impact on thinking patterns and behaviors.  Pt to verbalize an understanding of the role that distorted thinking plays in creating fears, excessive worry, and ruminations.  Progress: 20% Target Date:  06/13/2023 Frequency:  Bi-weekly Modality:  Cognitive Behavioral Therapy Interventions by Therapist:  Therapist will use CBT, Mindfulness exercises, Coping skills and Referrals, as needed by client. Client has verbally approved this treatment plan.  Ivan Anchors, [facility]

## 2022-07-12 ENCOUNTER — Other Ambulatory Visit: Payer: Self-pay | Admitting: Behavioral Health

## 2022-07-12 DIAGNOSIS — F902 Attention-deficit hyperactivity disorder, combined type: Secondary | ICD-10-CM

## 2022-07-26 ENCOUNTER — Other Ambulatory Visit: Payer: Self-pay | Admitting: Behavioral Health

## 2022-07-26 DIAGNOSIS — F411 Generalized anxiety disorder: Secondary | ICD-10-CM

## 2022-07-26 DIAGNOSIS — F3289 Other specified depressive episodes: Secondary | ICD-10-CM

## 2022-07-26 DIAGNOSIS — F902 Attention-deficit hyperactivity disorder, combined type: Secondary | ICD-10-CM

## 2022-07-27 NOTE — Telephone Encounter (Signed)
Pt LVM @ 9:49a requesting refill of Sertraline

## 2022-08-04 ENCOUNTER — Ambulatory Visit (INDEPENDENT_AMBULATORY_CARE_PROVIDER_SITE_OTHER): Payer: BC Managed Care – PPO | Admitting: Psychology

## 2022-08-04 DIAGNOSIS — F411 Generalized anxiety disorder: Secondary | ICD-10-CM

## 2022-08-04 NOTE — Progress Notes (Signed)
[facility] Counselor/Therapist Progress Note  Patient ID: Brian Maldonado, MRN: [id number],    Date: 08/04/2022  Time Spent: 45 mins  Treatment Type: Individual Therapy  Reported Symptoms: Pt presented for a follow up session, via webex video.  Pt granted consent for session, stating that he is in his car with no one else present.  I shared with pt that I am in my office with no one else here either.    Mental Status Exam: Appearance:  Casual     Behavior: Appropriate  Motor: Normal  Speech/Language:  Clear and Coherent  Affect: Appropriate  Mood: normal  Thought process: normal  Thought content:   WNL  Sensory/Perceptual disturbances:   WNL  Orientation: oriented to person, place, and time/date  Attention: Good  Concentration: Good  Memory: WNL  Fund of knowledge:  Good  Insight:   Good  Judgment:  Good  Impulse Control: Good   Risk Assessment: Danger to Self:  No Self-injurious Behavior: No Danger to Others: No Duty to Warn:no Physical Aggression / Violence:No  Access to Firearms a concern: No  Gang Involvement:No   Subjective: Pt shares that he has "been doing pretty good since our last session.  We had a good trip to [place] and [place].  We all had a good time.  Malachy Mood (Meredith's mom) was sick the first few days we were there but she got better."  Pt also shares that work is "going.  They have renewed the dress code at work and so we have to be more careful about what we wear.  It is kind of a mixed bag."  Pt shares [name] had a stomach bug the week before they went to [facility] and then had a cold the week they got back, so she missed three straight weeks of school.  Pt shares they have had a former employee move to a different county to work.  They also have several new employees in the office and they are afraid of asking questions about what they need to do.  Pt has offered to help them and they appreciate that.  Pt shares that, by the end of this week, he  hopes to be caught up at work from being out for vacation and for his bereavement leave with his dad's passing.  Pt shares he talked with his mom yesterday to check in with her and she is having trouble with the loss of her husband (pt's dad).  Pt tried to be supportive of her.  Meredith's work is going good; there are several closings happening and then there might be some slow times as well.  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  This new experience has been rewarding for pt.  Pt continues to take his Zoloft and knows that it is benefiting him.  He continues to lead worship at church as well and that is going well too.  He met with the pastor last night about what their focus needs to be and he enjoyed that conversation.  Encouraged pt to continue with his self care activities until  our follow up session in 4 wks.  Interventions: Cognitive Behavioral Therapy  Diagnosis:Generalized anxiety disorder  Plan: Treatment Plan Strengths/Abilities:  Intelligent, Intuitive, Willing to participate in therapy Treatment Preferences:  Outpatient Individual Therapy Statement of Needs:  Patient is to use CBT, mindfulness and coping skills to help manage and/or decrease symptoms associated with their diagnosis. Symptoms:  Depressed/Irritable mood, worry, social withdrawal Problems Addressed:  Depressive thoughts, Sadness, Sleep issues, etc. Long Term Goals:  Pt to reduce overall level, frequency, and intensity of the feelings of depression/anxiety as evidenced by decreased irritability, negative self talk, and helpless feelings from 6 to 7 days/week to 0 to 1 days/week, per client  report, for at least 3 consecutive months.  Progress: 20% Short Term Goals:  Pt to verbally express understanding of the relationship between feelings of depression/anxiety and their impact on thinking patterns and behaviors.  Pt to verbalize an understanding of the role that distorted thinking plays in creating fears, excessive worry, and ruminations.  Progress: 20% Target Date:  06/13/2023 Frequency:  Bi-weekly Modality:  Cognitive Behavioral Therapy Interventions by Therapist:  Therapist will use CBT, Mindfulness exercises, Coping skills and Referrals, as needed by client. Client has verbally approved this treatment plan.  Ivan Anchors, [facility]

## 2022-08-12 ENCOUNTER — Telehealth: Payer: Self-pay | Admitting: Behavioral Health

## 2022-08-12 ENCOUNTER — Other Ambulatory Visit: Payer: Self-pay

## 2022-08-12 DIAGNOSIS — F902 Attention-deficit hyperactivity disorder, combined type: Secondary | ICD-10-CM

## 2022-08-12 MED ORDER — AMPHETAMINE-DEXTROAMPHETAMINE 15 MG PO TABS: 1.0000 | ORAL_TABLET | Freq: Two times a day (BID) | ORAL | 0 refills | Status: DC

## 2022-08-12 NOTE — Telephone Encounter (Signed)
Pended.

## 2022-08-12 NOTE — Telephone Encounter (Signed)
Please RF Adderall 7m to : [facility] [place] [address] [address] [id number]  [address]-2[address]19

## 2022-08-16 ENCOUNTER — Encounter: Payer: Self-pay | Admitting: Behavioral Health

## 2022-08-16 ENCOUNTER — Ambulatory Visit: Payer: BC Managed Care – PPO | Admitting: Behavioral Health

## 2022-08-16 DIAGNOSIS — F3289 Other specified depressive episodes: Secondary | ICD-10-CM

## 2022-08-16 DIAGNOSIS — F411 Generalized anxiety disorder: Secondary | ICD-10-CM

## 2022-08-16 DIAGNOSIS — F902 Attention-deficit hyperactivity disorder, combined type: Secondary | ICD-10-CM

## 2022-08-16 MED ORDER — SERTRALINE HCL 100 MG PO TABS: 100.0000 mg | ORAL_TABLET | Freq: Every day | ORAL | 1 refills | Status: DC

## 2022-08-16 NOTE — Progress Notes (Signed)
[facility] Med Check  Patient ID: Brian Maldonado,  MRN: [id number]  PCP: Tonia Ghent, MD  Date of Evaluation: 08/16/2022 Time spent:20 minutes  Chief Complaint:  Chief Complaint   Anxiety; Depression; Follow-up; Medication Refill; Patient Education     HISTORY/CURRENT STATUS: HPI  Brian Maldonado, 37 year old male presents to this office via video visit for follow up and medication management.  No changes this visit. He says that he continues to noticed significant improvement with anxiety and depression since last visit.  His hair pulling has improved to almost zero since using NAC supplement.  No more bald patches in beard. He does not want to change his medications at this time. Says his anxiety today is 1/10 and depression is 0/10. He is sleeping 7-8 hours per night. No mania, no psychosis, No SI/HI. Request f/u every 6 months.    No previous psychiatric medication failures     Individual Medical History/ Review of Systems: Changes? :No   Allergies: Sulfa antibiotics  Current Medications:  Current Outpatient Medications:    albuterol (VENTOLIN HFA) 108 (90 Base) MCG/ACT inhaler, Inhale 2 puffs into the lungs every 6 (six) hours as needed for wheezing or shortness of breath., Disp: 8 g, Rfl: 0   amphetamine-dextroamphetamine (ADDERALL) 15 MG tablet, Take 1 tablet by mouth 2 (two) times daily., Disp: 60 tablet, Rfl: 0   amphetamine-dextroamphetamine (ADDERALL) 15 MG tablet, Take 1 tablet by mouth 2 (two) times daily., Disp: 60 tablet, Rfl: 0   amphetamine-dextroamphetamine (ADDERALL) 15 MG tablet, Take 1 tablet by mouth 2 (two) times daily., Disp: 60 tablet, Rfl: 0   Azelastine HCl (ASTEPRO) 0.15 % SOLN, Place 1 spray into the nose every morning., Disp: , Rfl:    benzonatate (TESSALON) 100 MG capsule, Take 1 capsule (100 mg total) by mouth 3 (three) times daily as needed., Disp: 30 capsule, Rfl: 0   fluticasone (FLONASE) 50 MCG/ACT nasal spray, Place 1 spray into both  nostrils at bedtime., Disp: 16 g, Rfl: 2   Glutamine 500 MG TABS, Take by mouth every morning., Disp: , Rfl:    Multiple Vitamin (MULTIVITAMIN) tablet, Take 1 tablet by mouth daily., Disp: , Rfl:    oseltamivir (TAMIFLU) 75 MG capsule, Take 1 capsule (75 mg total) by mouth 2 (two) times daily., Disp: 10 capsule, Rfl: 0   OVER THE COUNTER MEDICATION, [place] Amino Acids - after workouts., Disp: , Rfl:    promethazine-dextromethorphan (PROMETHAZINE-DM) 6.25-15 MG/5ML syrup, Take 5 mLs by mouth 4 (four) times daily as needed., Disp: 118 mL, Rfl: 0   sertraline (ZOLOFT) 100 MG tablet, Take 1 tablet (100 mg total) by mouth daily., Disp: 90 tablet, Rfl: 1   vitamin B-12 (CYANOCOBALAMIN) 1000 MCG tablet, Take 1,000 mcg by mouth daily., Disp: , Rfl:  Medication Side Effects: none  Family Medical/ Social History: Changes? No  MENTAL HEALTH EXAM:  There were no vitals taken for this visit.There is no height or weight on file to calculate BMI.  General Appearance: Casual, Neat, and Well Groomed  Eye Contact:  Good  Speech:  Clear and Coherent  Volume:  Normal  Mood:  Anxious, Depressed, and Dysphoric  Affect:  Appropriate  Thought Process:  Coherent  Orientation:  Full (Time, Place, and Person)  Thought Content: Logical   Suicidal Thoughts:  No  Homicidal Thoughts:  No  Memory:  WNL  Judgement:  Good  Insight:  Good  Psychomotor Activity:  Normal  Concentration:  Concentration: Good  Recall:  Good  Fund of Knowledge: Good  Language: Good  Assets:  Desire for Improvement  ADL's:  Intact  Cognition: WNL  Prognosis:  Good    DIAGNOSES:    ICD-10-CM   1. Generalized anxiety disorder  F41.1 sertraline (ZOLOFT) 100 MG tablet    2. Attention deficit hyperactivity disorder (ADHD), combined type  F90.2 sertraline (ZOLOFT) 100 MG tablet    3. Other depression  F32.89 sertraline (ZOLOFT) 100 MG tablet      Receiving Psychotherapy: yes   RECOMMENDATIONS:    Greater than 50% of   30 min face to face time with patient was spent on counseling and coordination of care. We discussed his continued significant improvement  with anxiety and depression. He is very pleased with his progress and medication and is happy with his medications.  Reports a 90%  improvement with hair pulling. He does not want to adjust medications at this time. He is continuing in psychotherapy monthly.    We agreed to:   To Continue Zoloft to 100 mg daily Continue NAC 3600 mg daily To take with a little food to help with any nausea. Will report side effects or worsening symptoms To follow up in 6 months to reassess per pt. Provided emergency contact information Reviewed PDMP      Elwanda Brooklyn, NP

## 2022-09-01 ENCOUNTER — Ambulatory Visit (INDEPENDENT_AMBULATORY_CARE_PROVIDER_SITE_OTHER): Payer: BC Managed Care – PPO | Admitting: Psychology

## 2022-09-01 DIAGNOSIS — F411 Generalized anxiety disorder: Secondary | ICD-10-CM

## 2022-09-01 NOTE — Progress Notes (Signed)
[facility] Counselor/Therapist Progress Note  Patient ID: DAMANTE TREWIN, MRN: [id number],    Date: 09/01/2022  Time Spent: 60 mins  Treatment Type: Individual Therapy  Reported Symptoms: Pt presented for a follow up session, via webex video.  Pt granted consent for session, stating that he is in his car with no one else present.  I shared with pt that I am in my office with no one else here either.    Mental Status Exam: Appearance:  Casual     Behavior: Appropriate  Motor: Normal  Speech/Language:  Clear and Coherent  Affect: Appropriate  Mood: normal  Thought process: normal  Thought content:   WNL  Sensory/Perceptual disturbances:   WNL  Orientation: oriented to person, place, and time/date  Attention: Good  Concentration: Good  Memory: WNL  Fund of knowledge:  Good  Insight:   Good  Judgment:  Good  Impulse Control: Good   Risk Assessment: Danger to Self:  No Self-injurious Behavior: No Danger to Others: No Duty to Warn:no Physical Aggression / Violence:No  Access to Firearms a concern: No  Gang Involvement:No   Subjective: Pt shares that he has "been doing pretty good since our last session.  There have been good days and bad days.  I had a pretty big argument with [name] but that kind of resolved itself."  Pt shares that [name] was telling him to leave to go stay with his mom; this seems to be Meredith's 'go to move' and that is not comfortable for pt.  [name] was telling him that he makes her life makes her life harder.  Pt shares that [name] was also pre-menstrual during this time as well.  "She was super agitated and fussing at [name] as well."  Pt shares that she is even getting frustrated with pt about what songs he is choosing for church.  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  Pt shares that [name] has some OCD traits.  Wondering is [name] might have some bipolar traits.  Pt shares that he has talked to Colby (Meredith's  mom) about it and told her that no one ever challenges [name] for behaving this way to everyone.  Pt shares he did choose to confront her about being mean to him and telling him to leave the house.  Encouraged pt to sit down with [name] during a period of time that is calm and tell her it is not OK for her to be demanding that he leave the home once a month when she is mad or not feeling well.  Pt shares that Malachy Mood has suggested for [name] to seek a therapist for herself.  Pt shares he feels like he is doing well with the grief related to his dad's passing.  He continues to check in with his mom to make sure she is doing OK too.  Pt shares that work "is not bad but not great.  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  Encouraged pt to continue with his self care activities until our follow up session in 4 wks.  Interventions: Cognitive Behavioral Therapy  Diagnosis:Generalized anxiety disorder  Plan: Treatment Plan Strengths/Abilities:  Intelligent, Intuitive, Willing to participate in therapy Treatment Preferences:  Outpatient Individual Therapy Statement of Needs:  Patient is to use CBT, mindfulness and coping skills to help manage and/or decrease symptoms associated with their diagnosis. Symptoms:  Depressed/Irritable mood, worry, social withdrawal Problems Addressed:  Depressive thoughts, Sadness, Sleep issues, etc. Long Term Goals:  Pt to reduce overall level, frequency, and intensity of the feelings of depression/anxiety as evidenced  by decreased irritability, negative self talk, and helpless feelings from 6 to 7 days/week to 0 to 1 days/week, per client report, for at least 3 consecutive months.  Progress: 20% Short Term Goals:  Pt to verbally express understanding of the relationship between feelings of depression/anxiety and their impact on thinking patterns and behaviors.  Pt to verbalize an understanding of the role that distorted thinking plays in creating fears, excessive worry, and ruminations.  Progress: 20% Target Date:  06/13/2023 Frequency:  Bi-weekly Modality:  Cognitive Behavioral Therapy Interventions by Therapist:  Therapist will use CBT, Mindfulness exercises, Coping skills and Referrals, as needed by client. Client has verbally approved this treatment plan.  Ivan Anchors, [facility]

## 2022-09-13 ENCOUNTER — Other Ambulatory Visit: Payer: Self-pay | Admitting: Behavioral Health

## 2022-09-13 DIAGNOSIS — F902 Attention-deficit hyperactivity disorder, combined type: Secondary | ICD-10-CM

## 2022-09-29 ENCOUNTER — Ambulatory Visit (INDEPENDENT_AMBULATORY_CARE_PROVIDER_SITE_OTHER): Payer: BC Managed Care – PPO | Admitting: Psychology

## 2022-09-29 DIAGNOSIS — F411 Generalized anxiety disorder: Secondary | ICD-10-CM

## 2022-09-29 NOTE — Progress Notes (Addendum)
[facility] Counselor/Therapist Progress Note  Patient ID: KORNELL HOR, MRN: [id number],    Date: 09/29/2022  Time Spent: 45 mins  Treatment Type: Individual Therapy  Reported Symptoms: Pt presented for a follow up session, via webex video.  Pt granted consent for session, stating that he is in his car with no one else present.  I shared with pt that I am in my office with no one else here either.    Mental Status Exam: Appearance:  Casual     Behavior: Appropriate  Motor: Normal  Speech/Language:  Clear and Coherent  Affect: Appropriate  Mood: normal  Thought process: normal  Thought content:   WNL  Sensory/Perceptual disturbances:   WNL  Orientation: oriented to person, place, and time/date  Attention: Good  Concentration: Good  Memory: WNL  Fund of knowledge:  Good  Insight:   Good  Judgment:  Good  Impulse Control: Good   Risk Assessment: Danger to Self:  No Self-injurious Behavior: No Danger to Others: No Duty to Warn:no Physical Aggression / Violence:No  Access to Firearms a concern: No  Gang Involvement:No   Subjective: Pt shares that he has "been doing pretty good since our last session.  This pollen has been hard on me for the past 2 wks.  I have been feeling horrible but I know I am not sick."  Pt shares that everything else has been OK.  Pt shares he did talk with [name] about her being ugly to him from time to time and telling him to leave the house.  He talked with her in the car one evening when they were driving home.  He took responsibility for doing things that might be difficult for her but she can no longer try to get him to leave the house.  He told her he is not leaving now or in the future.  He shares that she did not have a huge response but he was glad that he got that out to her.  Pt shares that she has not gotten as angry at him since then and has not said that sort of thing again.  [name] is at home working this week with [name]  who has Spring Break this week and is somewhat frustrated about that.  Pt shares that they are going to [facility] in June and [place] in Oct and in Dec they may go back to [place]; all of these are dance related for [name] and the family.  They will also likely want to go to the beach for a vacation this summer.  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  He believes he will get an interview but is not sure if he will get the job; he is OK either way "because I still have a job."  Pt has also enjoyed doing work in their yard.  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  He has not gone to the gym this year and he is OK with that.  Pt shares that Elnita Maxwell has had to put her mom in a nursing home recently because of some falls she has had and some dementia as well.  Cheryl's brother is not being very helpful during this time and it all lands  on [place].  Pt also mentions that at nights when he goes to bed, he sometimes feels uneasy about going to sleep and this lasts for a few minutes; he often has vivid dreams.  He is not afraid of his dreams but sometimes they can feel very real and can create anxiety for him.  Talked with pt about giving himself permission to fall asleep and letting himself drift off to sleep.  Pt continues to take his Zoloft and knows that it is benefiting him.  Encouraged pt to continue with his self care activities until our follow up session in 4 wks.  Interventions: Cognitive Behavioral Therapy  Diagnosis:Generalized anxiety disorder  Plan: Treatment Plan Strengths/Abilities:  Intelligent, Intuitive, Willing to participate in therapy Treatment Preferences:  Outpatient Individual Therapy Statement of Needs:  Patient is to use CBT, mindfulness and coping skills to help manage and/or  decrease symptoms associated with their diagnosis. Symptoms:  Depressed/Irritable mood, worry, social withdrawal Problems Addressed:  Depressive thoughts, Sadness, Sleep issues, etc. Long Term Goals:  Pt to reduce overall level, frequency, and intensity of the feelings of depression/anxiety as evidenced by decreased irritability, negative self talk, and helpless feelings from 6 to 7 days/week to 0 to 1 days/week, per client report, for at least 3 consecutive months.  Progress: 20% Short Term Goals:  Pt to verbally express understanding of the relationship between feelings of depression/anxiety and their impact on thinking patterns and behaviors.  Pt to verbalize an understanding of the role that distorted thinking plays in creating fears, excessive worry, and ruminations.  Progress: 20% Target Date:  06/13/2023 Frequency:  Bi-weekly Modality:  Cognitive Behavioral Therapy Interventions by Therapist:  Therapist will use CBT, Mindfulness exercises, Coping skills and Referrals, as needed by client. Client has verbally approved this treatment plan.  Ivan Anchors, [facility]

## 2022-10-11 ENCOUNTER — Other Ambulatory Visit: Payer: Self-pay | Admitting: Behavioral Health

## 2022-10-11 ENCOUNTER — Telehealth: Payer: Self-pay | Admitting: Behavioral Health

## 2022-10-11 DIAGNOSIS — F902 Attention-deficit hyperactivity disorder, combined type: Secondary | ICD-10-CM

## 2022-10-11 MED ORDER — AMPHETAMINE-DEXTROAMPHETAMINE 15 MG PO TABS: 1.0000 | ORAL_TABLET | Freq: Two times a day (BID) | ORAL | 0 refills | Status: DC

## 2022-10-11 NOTE — Telephone Encounter (Signed)
Patient called in for refill on Adderall 15mg  states they have it in stock and needs prescription sent over. Ph: [phone] Appt 8/20 Pharmacy [facility] [place]

## 2022-10-14 ENCOUNTER — Other Ambulatory Visit: Payer: Self-pay | Admitting: Behavioral Health

## 2022-10-14 DIAGNOSIS — F902 Attention-deficit hyperactivity disorder, combined type: Secondary | ICD-10-CM

## 2022-10-27 ENCOUNTER — Ambulatory Visit (INDEPENDENT_AMBULATORY_CARE_PROVIDER_SITE_OTHER): Payer: BC Managed Care – PPO | Admitting: Psychology

## 2022-10-27 DIAGNOSIS — F411 Generalized anxiety disorder: Secondary | ICD-10-CM

## 2022-10-27 NOTE — Progress Notes (Signed)
[facility] Counselor/Therapist Progress Note  Patient ID: EREK KOWAL, MRN: [id number],    Date: 10/27/2022  Time Spent: 45 mins  Treatment Type: Individual Therapy  Reported Symptoms: Pt presented for a follow up session, via webex video.  Pt granted consent for session, stating that he is in his car with no one else present.  I shared with pt that I am in my office with no one else here either.    Mental Status Exam: Appearance:  Casual     Behavior: Appropriate  Motor: Normal  Speech/Language:  Clear and Coherent  Affect: Appropriate  Mood: normal  Thought process: normal  Thought content:   WNL  Sensory/Perceptual disturbances:   WNL  Orientation: oriented to person, place, and time/date  Attention: Good  Concentration: Good  Memory: WNL  Fund of knowledge:  Good  Insight:   Good  Judgment:  Good  Impulse Control: Good   Risk Assessment: Danger to Self:  No Self-injurious Behavior: No Danger to Others: No Duty to Warn:no Physical Aggression / Violence:No  Access to Firearms a concern: No  Gang Involvement:No   Subjective: Pt shares that he has "been doing pretty good since our last session.  Nothing of note bad has happened.  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  Pt shares that he is feeling much better about his allergies since our last session.  Pt continues to have issues with his back and shoulder; he has not been working out recently and he is OK with that.  Pt shares that [name] has been good about not talking as ugly to him as she had been in the past, since our last session.  Pt shares that, whenever she is overstimulated, she will overreact to situations and say things under her breath from time to time.  Pt notes that [name] works hard, as he does, and she tends to relax in the evenings; he  spends some time most evenings cleaning the house or doing yard work and he feels there is some difference in what they are responsible for.  Pt uses the term "double standard" to refer to this situation; he shares that her brother's girlfriend has mentioned this same sort of family trait to pt.  Pt shares that they are going to [facility] in June and [place] in Oct and in Dec they may go back to [place]; all of these are dance related for [name] and the family.  They will also likely want to go to the beach for a vacation this summer.  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  He still enjoys leading worship at church.  Pt shares that [name] and Elnita Maxwell are both concerned about Cheryl's mom's health is not great and seems to be getting worse.  Pt continues to take his Zoloft and knows that it is benefiting him.  Pt shares that he has talked with his mom and she is doing OK but she is still struggling with the loss of her husband.  Pt is trying to talk with her regularly to help her manage.  Encouraged pt to continue with his self care activities until our follow up session in 4 wks.  Interventions: Cognitive Behavioral Therapy  Diagnosis:Generalized anxiety disorder  Plan: Treatment  Plan Strengths/Abilities:  Intelligent, Intuitive, Willing to participate in therapy Treatment Preferences:  Outpatient Individual Therapy Statement of Needs:  Patient is to use CBT, mindfulness and coping skills to help manage and/or decrease symptoms associated with their diagnosis. Symptoms:  Depressed/Irritable mood, worry, social withdrawal Problems Addressed:  Depressive thoughts, Sadness, Sleep issues, etc. Long Term Goals:  Pt to reduce overall level, frequency, and intensity of the feelings of depression/anxiety as evidenced by decreased irritability, negative self talk, and helpless feelings from 6 to 7 days/week to 0 to 1 days/week, per client report, for at least  3 consecutive months.  Progress: 20% Short Term Goals:  Pt to verbally express understanding of the relationship between feelings of depression/anxiety and their impact on thinking patterns and behaviors.  Pt to verbalize an understanding of the role that distorted thinking plays in creating fears, excessive worry, and ruminations.  Progress: 20% Target Date:  06/13/2023 Frequency:  Bi-weekly Modality:  Cognitive Behavioral Therapy Interventions by Therapist:  Therapist will use CBT, Mindfulness exercises, Coping skills and Referrals, as needed by client. Client has verbally approved this treatment plan.  Karie Kirks, [facility]

## 2022-11-15 ENCOUNTER — Telehealth: Payer: Self-pay | Admitting: Behavioral Health

## 2022-11-15 ENCOUNTER — Other Ambulatory Visit: Payer: Self-pay | Admitting: Behavioral Health

## 2022-11-15 DIAGNOSIS — F902 Attention-deficit hyperactivity disorder, combined type: Secondary | ICD-10-CM

## 2022-11-15 MED ORDER — AMPHETAMINE-DEXTROAMPHETAMINE 15 MG PO TABS: 1.0000 | ORAL_TABLET | Freq: Two times a day (BID) | ORAL | 0 refills | Status: DC

## 2022-11-15 NOTE — Telephone Encounter (Signed)
Pt called at 9:13a.  He is requesting refill of Adderall to   [facility] - [place], [place] - [address] [place], [place] [address] Phone: [phone]  Fax: [phone]    Next appt 8/20

## 2022-11-16 ENCOUNTER — Encounter: Payer: Self-pay | Admitting: Family Medicine

## 2022-11-22 ENCOUNTER — Other Ambulatory Visit: Payer: Self-pay | Admitting: Family Medicine

## 2022-11-22 ENCOUNTER — Encounter: Payer: Self-pay | Admitting: Family Medicine

## 2022-11-22 DIAGNOSIS — Z9109 Other allergy status, other than to drugs and biological substances: Secondary | ICD-10-CM | POA: Insufficient documentation

## 2022-11-24 ENCOUNTER — Ambulatory Visit (INDEPENDENT_AMBULATORY_CARE_PROVIDER_SITE_OTHER): Payer: BC Managed Care – PPO | Admitting: Psychology

## 2022-11-24 DIAGNOSIS — F411 Generalized anxiety disorder: Secondary | ICD-10-CM

## 2022-11-24 NOTE — Progress Notes (Signed)
[facility] Counselor/Therapist Progress Note  Patient ID: Brian Maldonado, MRN: [id number],    Date: 11/24/2022  Time Spent: 45 mins  Treatment Type: Individual Therapy  Reported Symptoms: Pt presented for a follow up session, via webex video.  Pt granted consent for session, stating that he is in his home with no one else present.  I shared with pt that I am in my office with no one else here either.    Mental Status Exam: Appearance:  Casual     Behavior: Appropriate  Motor: Normal  Speech/Language:  Clear and Coherent  Affect: Appropriate  Mood: normal  Thought process: normal  Thought content:   WNL  Sensory/Perceptual disturbances:   WNL  Orientation: oriented to person, place, and time/date  Attention: Good  Concentration: Good  Memory: WNL  Fund of knowledge:  Good  Insight:   Good  Judgment:  Good  Impulse Control: Good   Risk Assessment: Danger to Self:  No Self-injurious Behavior: No Danger to Others: No Duty to Warn:no Physical Aggression / Violence:No  Access to Firearms a concern: No  Gang Involvement:No   Subjective: Pt shares that he has "been doing pretty good since our last session.  I woke up today with a headache so I took today off.  [name] and [name] are at [name] with one of Meredith's friends and her kids.  They are planning to come back tomorrow or Friday."  Pt shares he is taking this time to do things around the house and feels good about what he is accomplishing.  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  They are going to be moving into a new facility sometime over the summer and the minister Arlys John) is really excited about the move.  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  He is trying to follow up with the hiring manager and HR dept to still try to get considered.  Pt shares that his music ministry duties and his working around the house are self care activities for him; he has also been working to restore a guitar his dad gave him years ago.  He is really happy with how the guitar sounds now.  He has also talked with his sister in [place] this past weekend for at least an hour; "we had a great conversation."  He plans to call her every Sunday afternoon for the next period of time for them to stay connected.  He has only known her for the past 6 yrs.  They both agree that they have similarities in the way they do/say things and how they process things.  They feel like they got those traits from their dad.  "I also feel like I am still in a good place with [name]; we have not been arguing lately.  We are taking a break right now from trying to have another baby right now but I think that will change in the future again."  Encouraged pt to continue with his self care activities until our follow up session in 4 wks.  Interventions: Cognitive Behavioral Therapy  Diagnosis:Generalized anxiety disorder  Plan: Treatment Plan Strengths/Abilities:  Intelligent, Intuitive, Willing to participate in therapy  Treatment Preferences:  Outpatient Individual Therapy Statement of Needs:  Patient is to use CBT, mindfulness and coping skills to help manage and/or decrease symptoms associated with their diagnosis. Symptoms:  Depressed/Irritable mood, worry, social withdrawal Problems Addressed:  Depressive thoughts, Sadness, Sleep issues, etc. Long Term Goals:  Pt to reduce overall level, frequency, and intensity of the feelings of depression/anxiety as evidenced by decreased irritability, negative self talk, and helpless feelings from 6 to 7 days/week to 0 to 1 days/week, per client report, for at least 3 consecutive months.  Progress: 20% Short Term Goals:  Pt to verbally express  understanding of the relationship between feelings of depression/anxiety and their impact on thinking patterns and behaviors.  Pt to verbalize an understanding of the role that distorted thinking plays in creating fears, excessive worry, and ruminations.  Progress: 20% Target Date:  06/13/2023 Frequency:  Bi-weekly Modality:  Cognitive Behavioral Therapy Interventions by Therapist:  Therapist will use CBT, Mindfulness exercises, Coping skills and Referrals, as needed by client. Client has verbally approved this treatment plan.  Karie Kirks, [facility]

## 2022-11-29 ENCOUNTER — Other Ambulatory Visit: Payer: Self-pay | Admitting: Behavioral Health

## 2022-11-29 DIAGNOSIS — F3289 Other specified depressive episodes: Secondary | ICD-10-CM

## 2022-11-29 DIAGNOSIS — F902 Attention-deficit hyperactivity disorder, combined type: Secondary | ICD-10-CM

## 2022-11-29 DIAGNOSIS — F411 Generalized anxiety disorder: Secondary | ICD-10-CM

## 2022-11-30 ENCOUNTER — Telehealth: Payer: Self-pay | Admitting: Behavioral Health

## 2022-11-30 NOTE — Telephone Encounter (Signed)
Patient called in for refill on Adderall 15mg . States that he needs 6/13 as he is going to [place] and will not be back until 6/21 and that would make him short about three days. They do have in stock. Ph: [phone] Appt 8/20 Pharmacy [facility] [address] [place]

## 2022-12-01 ENCOUNTER — Other Ambulatory Visit
Admission: RE | Admit: 2022-12-01 | Discharge: 2022-12-01 | Disposition: A | Discharge status: Home / Self Care | Payer: BC Managed Care – PPO | Source: Ambulatory Visit | Attending: Primary Care | Admitting: Primary Care

## 2022-12-01 ENCOUNTER — Encounter: Payer: Self-pay | Admitting: Primary Care

## 2022-12-01 ENCOUNTER — Ambulatory Visit: Payer: BC Managed Care – PPO | Admitting: Primary Care

## 2022-12-01 VITALS — BP 122/82 | HR 85 | Temp 97.8°F | Ht 69.0 in | Wt 239.0 lb

## 2022-12-01 DIAGNOSIS — Z9109 Other allergy status, other than to drugs and biological substances: Secondary | ICD-10-CM | POA: Diagnosis not present

## 2022-12-01 DIAGNOSIS — G4733 Obstructive sleep apnea (adult) (pediatric): Secondary | ICD-10-CM

## 2022-12-01 DIAGNOSIS — J310 Chronic rhinitis: Secondary | ICD-10-CM | POA: Diagnosis not present

## 2022-12-01 MED ORDER — ROPINIROLE HCL 0.5 MG PO TABS: ORAL_TABLET | ORAL | 0 refills | Status: DC

## 2022-12-01 NOTE — Progress Notes (Signed)
@Patient  ID: Brian Maldonado, male    DOB: [birth year], 37 y.o.   MRN: [id number]  Chief Complaint  Patient presents with   Follow-up    Has Oral Appliance. Still snoring really bad. Constant nasal congestion. Started using his CPAP again with full face mask.    Referring provider: Joaquim Nam, MD   HPI: 37 year old male, never smoked. PMH significant for OSA. Patient of Dr. Craige Cotta.  12/01/2022 Patient presents today for follow-up for OSA.  He had a home sleep study in 2018 that showed moderate sleep apnea.  He was started on auto CPAP.  He tried several different CPAP masks but had difficulty tolerating and was unable to keep mask on. He was referred to Dr. Toni Arthurs and fitted for an oral appliance.  This did not initially help with snoring so he resumed CPAP use.  He is currently using oral device along with CPAP.  He continues to have snoring symptoms along with nasal congestion.  He has seen ENT in the past but not recently. He takes over-the-counter allergy medication and Flonase/ Astelin.  He has an appointment with an allergist in August. When he lost some weight and snoring improved.   Continues to have snoring symptoms despite compliance with oral appliance and CPAP. Sleep apnea is well controlled. He has associated chronic sinus congestion. He uses a full face mask. Gets supplies on his own. CPAP machine is working fine.    Allergies  Allergen Reactions   Sulfa Antibiotics     Unknown;  Had as a child and does not recall reaction    Immunization History  Administered Date(s) Administered   Hepatitis B 04/09/1998, 05/07/1998, 10/01/1998   Influenza,inj,Quad PF,6+ Mos 06/01/2016, 04/18/2017   Influenza-Unspecified 04/28/2022   Td 12/11/2006   Tdap 11/10/2014, 01/27/2015    Past Medical History:  Diagnosis Date   Cervicogenic headache 07/01/2015   Left side   GERD (gastroesophageal reflux disease)    Headache    Left shoulder pain    S/P dilatation of esophageal  stricture    Sleep apnea 2018    Tobacco History: Social History   Tobacco Use  Smoking Status Never  Smokeless Tobacco Never   Counseling given: Not Answered   Outpatient Medications Prior to Visit  Medication Sig Dispense Refill   Azelastine HCl (ASTEPRO) 0.15 % SOLN Place 1 spray into the nose every morning.     fluticasone (FLONASE) 50 MCG/ACT nasal spray Place 1 spray into both nostrils at bedtime. 16 g 2   Glutamine 500 MG TABS Take by mouth every morning.     Multiple Vitamin (MULTIVITAMIN) tablet Take 1 tablet by mouth daily.     OVER THE COUNTER MEDICATION Branch Chain Amino Acids - after workouts.     sertraline (ZOLOFT) 100 MG tablet TAKE 1 TABLET BY MOUTH DAILY 90 tablet 1   vitamin B-12 (CYANOCOBALAMIN) 1000 MCG tablet Take 1,000 mcg by mouth daily.     amphetamine-dextroamphetamine (ADDERALL) 15 MG tablet Take 1 tablet by mouth 2 (two) times daily. 60 tablet 0   albuterol (VENTOLIN HFA) 108 (90 Base) MCG/ACT inhaler Inhale 2 puffs into the lungs every 6 (six) hours as needed for wheezing or shortness of breath. (Patient not taking: Reported on 12/01/2022) 8 g 0   amphetamine-dextroamphetamine (ADDERALL) 15 MG tablet Take 1 tablet by mouth 2 (two) times daily. 60 tablet 0   amphetamine-dextroamphetamine (ADDERALL) 15 MG tablet Take 1 tablet by mouth 2 (two) times daily. 60 tablet  0   benzonatate (TESSALON) 100 MG capsule Take 1 capsule (100 mg total) by mouth 3 (three) times daily as needed. (Patient not taking: Reported on 12/01/2022) 30 capsule 0   oseltamivir (TAMIFLU) 75 MG capsule Take 1 capsule (75 mg total) by mouth 2 (two) times daily. (Patient not taking: Reported on 12/01/2022) 10 capsule 0   promethazine-dextromethorphan (PROMETHAZINE-DM) 6.25-15 MG/5ML syrup Take 5 mLs by mouth 4 (four) times daily as needed. (Patient not taking: Reported on 12/01/2022) 118 mL 0   No facility-administered medications prior to visit.    Review of Systems  Review of Systems   Constitutional: Negative.   HENT: Negative.    Respiratory: Negative.    Genitourinary: Negative.   Neurological: Negative.  Negative for syncope and speech difficulty.    Physical Exam  BP 122/82 (BP Location: Left Arm, Cuff Size: Large)   Pulse 85   Temp 97.8 F (36.6 C)   Ht 5\' 9"  (1.753 m)   Wt 239 lb (108.4 kg)   SpO2 97%   BMI 35.29 kg/m  Physical Exam Constitutional:      Appearance: Normal appearance.  HENT:     Head: Normocephalic and atraumatic.     Mouth/Throat:     Mouth: Mucous membranes are moist.     Pharynx: Oropharynx is clear.  Cardiovascular:     Rate and Rhythm: Normal rate and regular rhythm.  Pulmonary:     Effort: Pulmonary effort is normal.     Breath sounds: Normal breath sounds.  Neurological:     General: No focal deficit present.     Mental Status: He is alert and oriented to person, place, and time. Mental status is at baseline.  Psychiatric:        Mood and Affect: Mood normal.        Thought Content: Thought content normal.        Judgment: Judgment normal.      Lab Results:  CBC    Component Value Date/Time   WBC 5.1 02/03/2022 1558   RBC 4.54 02/03/2022 1558   HGB 14.2 02/03/2022 1558   HGB 15.1 04/07/2013 1842   HCT 40.9 02/03/2022 1558   HCT 42.6 04/07/2013 1842   PLT 186.0 02/03/2022 1558   PLT 186 04/07/2013 1842   MCV 90.0 02/03/2022 1558   MCV 90 04/07/2013 1842   MCH 31.4 07/10/2018 2033   MCHC 34.7 02/03/2022 1558   RDW 12.4 02/03/2022 1558   RDW 12.4 04/07/2013 1842   LYMPHSABS 1.2 02/03/2022 1558   MONOABS 0.5 02/03/2022 1558   EOSABS 0.2 02/03/2022 1558   BASOSABS 0.0 02/03/2022 1558    BMET    Component Value Date/Time   NA 139 02/03/2022 1558   NA 140 04/07/2013 1842   K 4.0 02/03/2022 1558   K 4.0 04/07/2013 1842   CL 99 02/03/2022 1558   CL 108 (H) 04/07/2013 1842   CO2 25 02/03/2022 1558   CO2 28 04/07/2013 1842   GLUCOSE 107 (H) 02/03/2022 1558   GLUCOSE 140 (H) 04/07/2013 1842   BUN 30  (H) 02/03/2022 1558   BUN 15 04/07/2013 1842   CREATININE 1.07 02/03/2022 1558   CREATININE 1.14 04/07/2013 1842   CALCIUM 9.3 02/03/2022 1558   CALCIUM 9.9 04/07/2013 1842   GFRNONAA >60 07/10/2018 2033   GFRNONAA >60 04/07/2013 1842   GFRAA >60 07/10/2018 2033   GFRAA >60 04/07/2013 1842    BNP No results found for: "BNP"  ProBNP No results found  for: "PROBNP"  Imaging: No results found.   Assessment & Plan:   OSA (obstructive sleep apnea) - Patient has moderate obstructive sleep apnea, sleep study in 2018.  Patient continues to have loud and disruptive snoring symptoms despite sleep apnea being well-controlled with oral device and CPAP.  Current pressure 5 to 20 cm H2O, residual AHI 0.9/hour. He does have chronic rhinitis/sinus congestion, he is compliant with antihistamines and nasal sprays.  We will refer patient to ENT for upper airway exam/possible surgical interventions.  He is not interested in inspire at this time.  Recommend patient get a wedge pillow.   Glenford Bayley, NP 12/06/2022

## 2022-12-01 NOTE — Patient Instructions (Addendum)
- Recommend you see an ear nose and throat doctor for upper airway exam due to persistent snoring despite sleep apnea being well-controlled with oral device and CPAP  - Look into getting a wedge pillow to elevate your head while sleeping to help with snoring - We will lower pressure settings to see if this can help with sleep - Start you on a medication called Requip for restless leg symptoms/periodic limb movement that could cause you to wake up at night - Continue over-the-counter antihistamine and nasal sprays - Will get respiratory allergy panel today to look for allergens that could be causing nasal congestion - Send me a MyChart message to let me know how Requip is helping  Orders: Respiratory allergy panel  Referral: ENT re: chronic rhinitis/ OSA   Follow-up: 8 weeks with [facility] NP    Sleep Apnea Sleep apnea affects breathing during sleep. It causes breathing to stop for 10 seconds or more, or to become shallow. People with sleep apnea usually snore loudly. It can also increase the risk of: Heart attack. Stroke. Being very overweight (obese). Diabetes. Heart failure. Irregular heartbeat. High blood pressure. The goal of treatment is to help you breathe normally again. What are the causes?  The most common cause of this condition is a collapsed or blocked airway. There are three kinds of sleep apnea: Obstructive sleep apnea. This is caused by a blocked or collapsed airway. Central sleep apnea. This happens when the brain does not send the right signals to the muscles that control breathing. Mixed sleep apnea. This is a combination of obstructive and central sleep apnea. What increases the risk? Being overweight. Smoking. Having a small airway. Being older. Being male. Drinking alcohol. Taking medicines to calm yourself (sedatives or tranquilizers). Having family members with the condition. Having a tongue or tonsils that are larger than normal. What are the signs or  symptoms? Trouble staying asleep. Loud snoring. Headaches in the morning. Waking up gasping. Dry mouth or sore throat in the morning. Being sleepy or tired during the day. If you are sleepy or tired during the day, you may also: Not be able to focus your mind (concentrate). Forget things. Get angry a lot and have mood swings. Feel sad (depressed). Have changes in your personality. Have less interest in sex, if you are male. Be unable to have an erection, if you are male. How is this treated?  Sleeping on your side. Using a medicine to get rid of mucus in your nose (decongestant). Avoiding the use of alcohol, medicines to help you relax, or certain pain medicines (narcotics). Losing weight, if needed. Changing your diet. Quitting smoking. Using a machine to open your airway while you sleep, such as: An oral appliance. This is a mouthpiece that shifts your lower jaw forward. A CPAP device. This device blows air through a mask when you breathe out (exhale). An EPAP device. This has valves that you put in each nostril. A BIPAP device. This device blows air through a mask when you breathe in (inhale) and breathe out. Having surgery if other treatments do not work. Follow these instructions at home: Lifestyle Make changes that your doctor recommends. Eat a healthy diet. Lose weight if needed. Avoid alcohol, medicines to help you relax, and some pain medicines. Do not smoke or use any products that contain nicotine or tobacco. If you need help quitting, ask your doctor. General instructions Take over-the-counter and prescription medicines only as told by your doctor. If you were given a  machine to use while you sleep, use it only as told by your doctor. If you are having surgery, make sure to tell your doctor you have sleep apnea. You may need to bring your device with you. Keep all follow-up visits. Contact a doctor if: The machine that you were given to use during sleep bothers  you or does not seem to be working. You do not get better. You get worse. Get help right away if: Your chest hurts. You have trouble breathing in enough air. You have an uncomfortable feeling in your back, arms, or stomach. You have trouble talking. One side of your body feels weak. A part of your face is hanging down. These symptoms may be an emergency. Get help right away. Call your local emergency services (911 in the [place] Do not wait to see if the symptoms will go away. Do not drive yourself to the hospital. Summary This condition affects breathing during sleep. The most common cause is a collapsed or blocked airway. The goal of treatment is to help you breathe normally while you sleep. This information is not intended to replace advice given to you by your health care provider. Make sure you discuss any questions you have with your health care provider. Document Revised: 01/21/2021 Document Reviewed: 05/23/2020 Elsevier Patient Education  2024 Elsevier Inc.   Ropinirole Tablets What is this medication? ROPINIROLE (roe PIN i role) treats the symptoms of Parkinson disease. It works by acting like dopamine, a substance in your body that helps manage movements and coordination. This reduces the symptoms of Parkinson, such as body stiffness and tremors. It may also be used to treat restless legs syndrome (RLS). This medicine may be used for other purposes; ask your health care provider or pharmacist if you have questions. COMMON BRAND NAME(S): Requip What should I tell my care team before I take this medication? They need to know if you have any of these conditions: Heart disease High blood pressure Kidney disease Liver disease Low blood pressure Narcolepsy Sleep apnea Tobacco use An unusual or allergic reaction to ropinirole, other medications, foods, dyes, or preservatives Pregnant or trying to get pregnant Breast-feeding How should I use this medication? Take this  medication by mouth with water. Take it as directed on the prescription label. You can take it with or without food. If it upsets your stomach, take it with food. If it upsets your stomach, take it with food. Keep taking this medication unless your care team tells you to stop. Stopping it too quickly can cause serious side effects. It can also make your condition worse. Talk to your care team about the use of this medication in children. Special care may be needed. Overdosage: If you think you have taken too much of this medicine contact a poison control center or emergency room at once. NOTE: This medicine is only for you. Do not share this medicine with others. What if I miss a dose? If you miss a dose, take it as soon as you can. If it is almost time for your next dose, take only that dose. Do not take double or extra doses. What may interact with this medication? Alcohol Antihistamines for allergy, cough and cold Certain medications for depression, anxiety, or mental health conditions Certain medications for seizures, such as phenobarbital, primidone Certain medications for sleep Ciprofloxacin Estrogen or progestin hormones Fluvoxamine General anesthetics, such as halothane, isoflurane, methoxyflurane, propofol Medications for blood pressure Medications that relax muscles for surgery Metoclopramide Opioid medications for  pain Rifampin Tobacco This list may not describe all possible interactions. Give your health care provider a list of all the medicines, herbs, non-prescription drugs, or dietary supplements you use. Also tell them if you smoke, drink alcohol, or use illegal drugs. Some items may interact with your medicine. What should I watch for while using this medication? Visit your care team for regular checks on your progress. Tell your care team if your symptoms do not start to get better or if they get worse. Do not suddenly stop taking this medication. You may develop a severe  reaction. Your care team will tell you how much medication to take. If your care team wants you to stop the medication, the dose may be slowly lowered over time to avoid any side effects. This medication may affect your coordination, reaction time, or judgement. Do not drive or operate machinery until you know how this medication affects you. Sit up or stand slowly to reduce the risk of dizzy or fainting spells. Drinking alcohol with this medication can increase the risk of these side effects. When taking this medication, you may fall asleep without notice. You may be doing activities, such as driving a car, talking, or eating. You may not feel drowsy before it happens. Contact your care team right away if this happens to you. There have been reports of increased sexual urges or other strong urges, such as gambling while taking this medication. If you experience any of these while taking this medication, you should report this to your care team as soon as possible. Your mouth may get dry. Chewing sugarless gum or sucking hard candy and drinking plenty of water may help. Contact your care team if the problem does not go away or is severe. What side effects may I notice from receiving this medication? Side effects that you should report to your care team as soon as possible: Allergic reactions--skin rash, itching, hives, swelling of the face, lips, tongue, or throat Falling asleep during daily activities Low blood pressure--dizziness, feeling faint or lightheaded, blurry vision Mood and behavior changes--anxiety, nervousness, irritability and restlessness, confusion, hallucinations, feeling distrust or suspicion of others New or worsening uncontrolled and repetitive movements of the face, mouth, or upper body Slow heartbeat--dizziness, feeling faint or lightheaded, confusion, trouble breathing, unusual weakness or fatigue Urges to engage in impulsive behaviors such as gambling, binge eating, sexual  activity, or shopping in ways that are unusual for you Side effects that usually do not require medical attention (report to your care team if they continue or are bothersome): Dizziness Drowsiness Nausea Swelling of the ankles, hands, or feet Unusual weakness or fatigue Upset stomach Vomiting This list may not describe all possible side effects. Call your doctor for medical advice about side effects. You may report side effects to FDA at 1-800-FDA-1088. Where should I keep my medication? Keep out of the reach of children and pets. Store at room temperature between 20 and 25 degrees C (68 and 77 degrees F). Protect from light and moisture. Keep the container tightly closed. Get rid of any unused medication after the expiration date. To get rid of medications that are no longer needed or have expired: Take the medication to a take-back program. Check with your pharmacy or law enforcement to find a location. If you cannot return the medication, check the label or package insert to see if the medication should be thrown out in the garbage or flushed down the toilet. If you are not sure, ask your care  team. If it is safe to put it in the trash, empty the medication out of the container. Mix the medication with cat litter, dirt, coffee grounds, or other unwanted substance. Seal the mixture in a bag or container. Put it in the trash. NOTE: This sheet is a summary. It may not cover all possible information. If you have questions about this medicine, talk to your doctor, pharmacist, or health care provider.  2024 Elsevier/Gold Standard (2021-09-23 00:00:00)

## 2022-12-01 NOTE — Assessment & Plan Note (Addendum)
-   Patient has moderate obstructive sleep apnea, sleep study in 2018.  Patient continues to have loud and disruptive snoring symptoms despite sleep apnea being well-controlled with oral device and CPAP.  Current pressure 5 to 20 cm H2O, residual AHI 0.9/hour. He does have chronic rhinitis/sinus congestion, he is compliant with antihistamines and nasal sprays.  We will refer patient to ENT for upper airway exam/possible surgical interventions.  He is not interested in inspire at this time.  Recommend patient get a wedge pillow.

## 2022-12-03 ENCOUNTER — Other Ambulatory Visit: Payer: Self-pay | Admitting: Behavioral Health

## 2022-12-03 DIAGNOSIS — F902 Attention-deficit hyperactivity disorder, combined type: Secondary | ICD-10-CM

## 2022-12-03 MED ORDER — AMPHETAMINE-DEXTROAMPHETAMINE 15 MG PO TABS
1.0000 | ORAL_TABLET | Freq: Two times a day (BID) | ORAL | 0 refills | Status: DC
Start: 2022-12-09 — End: 2023-01-10

## 2022-12-09 ENCOUNTER — Other Ambulatory Visit: Payer: Self-pay | Admitting: Behavioral Health

## 2022-12-09 DIAGNOSIS — F902 Attention-deficit hyperactivity disorder, combined type: Secondary | ICD-10-CM

## 2022-12-22 DIAGNOSIS — Z9109 Other allergy status, other than to drugs and biological substances: Secondary | ICD-10-CM | POA: Diagnosis not present

## 2022-12-23 LAB — MISC LABCORP TEST (SEND OUT): Labcorp test code: 10644

## 2022-12-27 DIAGNOSIS — Z9109 Other allergy status, other than to drugs and biological substances: Secondary | ICD-10-CM | POA: Diagnosis not present

## 2022-12-28 ENCOUNTER — Other Ambulatory Visit: Payer: Self-pay

## 2022-12-28 MED ORDER — ROPINIROLE HCL 1 MG PO TABS: 1.0000 mg | ORAL_TABLET | Freq: Every day | ORAL | 1 refills | Status: AC

## 2022-12-28 NOTE — Progress Notes (Signed)
I just sent you message back regarding Requip.  Can you also let patient know respiratory allergy panel was normal

## 2022-12-29 ENCOUNTER — Ambulatory Visit: Payer: BC Managed Care – PPO | Admitting: Psychology

## 2023-01-04 ENCOUNTER — Ambulatory Visit (INDEPENDENT_AMBULATORY_CARE_PROVIDER_SITE_OTHER): Payer: BC Managed Care – PPO | Admitting: Psychology

## 2023-01-04 DIAGNOSIS — F411 Generalized anxiety disorder: Secondary | ICD-10-CM | POA: Diagnosis not present

## 2023-01-04 NOTE — Progress Notes (Signed)
[facility] Counselor/Therapist Progress Note  Patient ID: Brian Maldonado, MRN: [id number],    Date: 01/04/2023  Time Spent: 60 mins    start time: 1100     end time:  1200  Treatment Type: Individual Therapy  Reported Symptoms: Pt presented for a follow up session, via Caregility video.  Pt granted consent for session, stating that he is in his car with no one else present; pt also understands the limits of virtual sessions.  I shared with pt that I am in my office with no one else here either.    Mental Status Exam: Appearance:  Casual     Behavior: Appropriate  Motor: Normal  Speech/Language:  Clear and Coherent  Affect: Appropriate  Mood: normal  Thought process: normal  Thought content:   WNL  Sensory/Perceptual disturbances:   WNL  Orientation: oriented to person, place, and time/date  Attention: Good  Concentration: Good  Memory: WNL  Fund of knowledge:  Good  Insight:   Good  Judgment:  Good  Impulse Control: Good   Risk Assessment: Danger to Self:  No Self-injurious Behavior: No Danger to Others: No Duty to Warn:no Physical Aggression / Violence:No  Access to Firearms a concern: No  Gang Involvement:No   Subjective: Pt shares that he has "been doing pretty good since our last session."  Pt somewhat waffles on whether things have been good or bad; "I thought about how my response would be to your question about how have I been since our last session."  Pt shares that they have been to [facility] since our last session; there were fun things about that trip with pt, Brian Maldonado, Brian Maldonado and his mother-in-law.  Church has continued to be good for pt; there is some drama associated with church but nothing that effects him negatively.  Brian Maldonado continues to do well in her work; she has been the best realtor in [name] for the past 2 years and that has been beneficial for her business.  One of their AC units at home went bad and it is being replaced today.  Pt  shares that work "has been a mixed bag.  I have been able to be able to catch up on most of my responsibilities.  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  I was not chosen for the position and that was not great.  It was aggravating that I did not even get a chance at the position."  Pt shares that he is also frustrated that one of his colleagues is able to work from home and pt is not able to do that.  Pt shares that he and the family are going to the beach in a couple of weeks and he is looking forward to being away from work and at [name] for that week.  They are going to [place] this year and they enjoy using pools and the ocean as well.  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  Pt shares his sleep has been good.  He has seen an allergist a couple of weeks ago and learned he is very allergic to dogs and to pollen; he will also see an ENT in a couple of weeks.  He is taking  medications for allergens now and is feeling better.  Pt shares that the church is still pursuing the new facility and they hope to move by the end of July or early August.  Pt continues to text his sister in [place] and things are going well there.  Pt shares that parts of the [place] trip were stressful and pt shares that he bought something for their cat and he was less than honest with Brian Maldonado about the purchase; "I had kind of mentioned it to [name] but was not specific about the purchase itself.  It became a big to-do.  Brian Maldonado accused me of lying again and over time, it is kind of dying down again; it was a big deal at the time."  Brian Maldonado talking again about him leaving over it but pt refused and she dropped the idea.  Pt shares that [employer] things from the same company and he brought that  up to [name] as well but she said it was not the same.  Pt recognizes that he just needs to be up front and honest about what he does and why he does it; we talked about owning his choices.  Brian Maldonado will often tell him "no" when he wants something and so he doesn't ask her for permission.  He is setting up a discretionary spending amount for himself to take care of this issue.  Encouraged pt to continue with his self care activities until our follow up session in 3 wks.  Interventions: Cognitive Behavioral Therapy  Diagnosis:Generalized anxiety disorder  Plan: Treatment Plan Strengths/Abilities:  Intelligent, Intuitive, Willing to participate in therapy Treatment Preferences:  Outpatient Individual Therapy Statement of Needs:  Patient is to use CBT, mindfulness and coping skills to help manage and/or decrease symptoms associated with their diagnosis. Symptoms:  Depressed/Irritable mood, worry, social withdrawal Problems Addressed:  Depressive thoughts, Sadness, Sleep issues, etc. Long Term Goals:  Pt to reduce overall level, frequency, and intensity of the feelings of depression/anxiety as evidenced by decreased irritability, negative self talk, and helpless feelings from 6 to 7 days/week to 0 to 1 days/week, per client report, for at least 3 consecutive months.  Progress: 20% Short Term Goals:  Pt to verbally express understanding of the relationship between feelings of depression/anxiety and their impact on thinking patterns and behaviors.  Pt to verbalize an understanding of the role that distorted thinking plays in creating fears, excessive worry, and ruminations.  Progress: 20% Target Date:  06/13/2023 Frequency:  Bi-weekly Modality:  Cognitive Behavioral Therapy Interventions by Therapist:  Therapist will use CBT, Mindfulness exercises, Coping skills and Referrals, as needed by client. Client has verbally approved this treatment plan.  Karie Kirks, [facility]

## 2023-01-10 ENCOUNTER — Telehealth: Payer: Self-pay | Admitting: Behavioral Health

## 2023-01-10 ENCOUNTER — Other Ambulatory Visit: Payer: Self-pay

## 2023-01-10 DIAGNOSIS — J3489 Other specified disorders of nose and nasal sinuses: Secondary | ICD-10-CM | POA: Diagnosis not present

## 2023-01-10 DIAGNOSIS — G4733 Obstructive sleep apnea (adult) (pediatric): Secondary | ICD-10-CM | POA: Diagnosis not present

## 2023-01-10 DIAGNOSIS — F902 Attention-deficit hyperactivity disorder, combined type: Secondary | ICD-10-CM

## 2023-01-10 DIAGNOSIS — J301 Allergic rhinitis due to pollen: Secondary | ICD-10-CM | POA: Diagnosis not present

## 2023-01-10 DIAGNOSIS — J343 Hypertrophy of nasal turbinates: Secondary | ICD-10-CM | POA: Diagnosis not present

## 2023-01-10 MED ORDER — AMPHETAMINE-DEXTROAMPHETAMINE 15 MG PO TABS
15.0000 mg | ORAL_TABLET | Freq: Two times a day (BID) | ORAL | 0 refills | Status: DC
Start: 2023-03-07 — End: 2023-07-10

## 2023-01-10 MED ORDER — AMPHETAMINE-DEXTROAMPHETAMINE 15 MG PO TABS
15.0000 mg | ORAL_TABLET | Freq: Two times a day (BID) | ORAL | 0 refills | Status: DC
Start: 2023-01-10 — End: 2023-07-10

## 2023-01-10 MED ORDER — AMPHETAMINE-DEXTROAMPHETAMINE 15 MG PO TABS
1.0000 | ORAL_TABLET | Freq: Two times a day (BID) | ORAL | 0 refills | Status: DC
Start: 2023-02-07 — End: 2023-07-10

## 2023-01-10 NOTE — Telephone Encounter (Signed)
Pt called at 9:25a requesting refill of Adderall to   [facility] - [place], [place] - [address] [place], [place] [address] Phone: [phone]  Fax: [phone]   He's wondering if he can get 3 scripts sent like in the past.   Next appt 8/20

## 2023-01-10 NOTE — Telephone Encounter (Signed)
 Pended.

## 2023-01-24 ENCOUNTER — Ambulatory Visit: Payer: BC Managed Care – PPO | Admitting: Primary Care

## 2023-01-26 ENCOUNTER — Ambulatory Visit (INDEPENDENT_AMBULATORY_CARE_PROVIDER_SITE_OTHER): Payer: BC Managed Care – PPO | Admitting: Psychology

## 2023-01-26 DIAGNOSIS — F411 Generalized anxiety disorder: Secondary | ICD-10-CM | POA: Diagnosis not present

## 2023-01-26 NOTE — Progress Notes (Signed)
[facility] Counselor/Therapist Progress Note  Patient ID: Brian Maldonado, MRN: [id number],    Date: 01/26/2023  Time Spent: 60 mins    start time: 1100     end time:  1200  Treatment Type: Individual Therapy  Reported Symptoms: Pt presented for a follow up session, via Caregility video.  Pt granted consent for session, stating that he is in his car with no one else present; pt also understands the limits of virtual sessions.  I shared with pt that I am in my office with no one else here either.    Mental Status Exam: Appearance:  Casual     Behavior: Appropriate  Motor: Normal  Speech/Language:  Clear and Coherent  Affect: Appropriate  Mood: normal  Thought process: normal  Thought content:   WNL  Sensory/Perceptual disturbances:   WNL  Orientation: oriented to person, place, and time/date  Attention: Good  Concentration: Good  Memory: WNL  Fund of knowledge:  Good  Insight:   Good  Judgment:  Good  Impulse Control: Good   Risk Assessment: Danger to Self:  No Self-injurious Behavior: No Danger to Others: No Duty to Warn:no Physical Aggression / Violence:No  Access to Firearms a concern: No  Gang Involvement:No   Subjective: Pt shares that he has "been doing pretty good since our last session.  I have been busy but good."  Pt shares that they were at the beach last week; "it rained several days but we had a nice, lazy beach trip."  Meredith's mom went with them and they get along well.  Pt shares that work is going fine.  Pt shares that they have been travelling a lot this year ([place], [employer], [employer], [employer]  [name] will start the 2nd grade in about 3 wks.  [name] is still sleeping in their bed pretty frequently and they are working to convert her to sleeping in her own bed soon.  Pt shares that work is going about the same.  Pt shares that church is going well; they are getting ready to move into the new space; he has two new people who are playing with them at  church and they are fitting in pretty well.  They did have one person in the music ministry that left because of personal reasons and that felt bad for pt.  Pt shares that he and [name] have been getting along pretty well lately; "I feel like I have been doing better with my responses to her and that seems to be helpful to me."  Elnita Maxwell has told pt that he knows that [place] needs pt and no one else could do for [place] what pt does.  [name] continues to be critical of pt from time to time; he knows [name] loves him; she can say hurtful things to pt from time to time but pt believes they have an upward trajectory.  Pt did see the ENT and pt has decided not to pursue surgery for his partially deviated septum.  Pt continues to use his CPAP machine nightly and knows it is beneficial for him.  Pt is also working on getting back to his healthy diet and he is wanting to get back on track with the gym; having some success but not total yet.  Pt has been texting with his sister in [place]; he has not talked with his mom recently and is OK with that.  Encouraged pt to continue with his self care activities until our follow up session in 4  wks.  Interventions: Cognitive Behavioral Therapy  Diagnosis:Generalized anxiety disorder  Plan: Treatment Plan Strengths/Abilities:  Intelligent, Intuitive, Willing to participate in therapy Treatment Preferences:  Outpatient Individual Therapy Statement of Needs:  Patient is to use CBT, mindfulness and coping skills to help manage and/or decrease symptoms associated with their diagnosis. Symptoms:  Depressed/Irritable mood, worry, social withdrawal Problems Addressed:  Depressive thoughts, Sadness, Sleep issues, etc. Long Term Goals:  Pt to reduce overall level, frequency, and intensity of the feelings of depression/anxiety as evidenced by decreased irritability, negative self talk, and helpless feelings from 6 to 7 days/week to 0 to 1 days/week, per client report, for at  least 3 consecutive months.  Progress: 20% Short Term Goals:  Pt to verbally express understanding of the relationship between feelings of depression/anxiety and their impact on thinking patterns and behaviors.  Pt to verbalize an understanding of the role that distorted thinking plays in creating fears, excessive worry, and ruminations.  Progress: 20% Target Date:  06/13/2023 Frequency:  Bi-weekly Modality:  Cognitive Behavioral Therapy Interventions by Therapist:  Therapist will use CBT, Mindfulness exercises, Coping skills and Referrals, as needed by client. Client has verbally approved this treatment plan.  Karie Kirks, [facility]

## 2023-01-27 ENCOUNTER — Ambulatory Visit: Payer: BC Managed Care – PPO | Admitting: Psychology

## 2023-01-31 DIAGNOSIS — F411 Generalized anxiety disorder: Secondary | ICD-10-CM | POA: Diagnosis not present

## 2023-01-31 DIAGNOSIS — F1921 Other psychoactive substance dependence, in remission: Secondary | ICD-10-CM | POA: Diagnosis not present

## 2023-01-31 DIAGNOSIS — F900 Attention-deficit hyperactivity disorder, predominantly inattentive type: Secondary | ICD-10-CM | POA: Diagnosis not present

## 2023-02-11 DIAGNOSIS — F1921 Other psychoactive substance dependence, in remission: Secondary | ICD-10-CM | POA: Diagnosis not present

## 2023-02-11 DIAGNOSIS — F900 Attention-deficit hyperactivity disorder, predominantly inattentive type: Secondary | ICD-10-CM | POA: Diagnosis not present

## 2023-02-11 DIAGNOSIS — F411 Generalized anxiety disorder: Secondary | ICD-10-CM | POA: Diagnosis not present

## 2023-02-14 ENCOUNTER — Telehealth: Payer: BC Managed Care – PPO | Admitting: Behavioral Health

## 2023-02-15 ENCOUNTER — Telehealth: Payer: BC Managed Care – PPO | Admitting: Behavioral Health

## 2023-02-21 DIAGNOSIS — F1921 Other psychoactive substance dependence, in remission: Secondary | ICD-10-CM | POA: Diagnosis not present

## 2023-02-21 DIAGNOSIS — F900 Attention-deficit hyperactivity disorder, predominantly inattentive type: Secondary | ICD-10-CM | POA: Diagnosis not present

## 2023-02-21 DIAGNOSIS — F411 Generalized anxiety disorder: Secondary | ICD-10-CM | POA: Diagnosis not present

## 2023-02-23 ENCOUNTER — Ambulatory Visit: Payer: BC Managed Care – PPO | Admitting: Psychology

## 2023-03-01 ENCOUNTER — Telehealth (INDEPENDENT_AMBULATORY_CARE_PROVIDER_SITE_OTHER): Payer: BC Managed Care – PPO | Admitting: Behavioral Health

## 2023-03-01 ENCOUNTER — Encounter: Payer: Self-pay | Admitting: Behavioral Health

## 2023-03-01 DIAGNOSIS — F3289 Other specified depressive episodes: Secondary | ICD-10-CM | POA: Diagnosis not present

## 2023-03-01 DIAGNOSIS — F411 Generalized anxiety disorder: Secondary | ICD-10-CM

## 2023-03-01 DIAGNOSIS — F902 Attention-deficit hyperactivity disorder, combined type: Secondary | ICD-10-CM | POA: Diagnosis not present

## 2023-03-01 MED ORDER — AMPHETAMINE-DEXTROAMPHETAMINE 20 MG PO TABS
20.0000 mg | ORAL_TABLET | Freq: Two times a day (BID) | ORAL | 0 refills | Status: DC
Start: 2023-03-12 — End: 2023-04-11

## 2023-03-01 MED ORDER — SERTRALINE HCL 100 MG PO TABS
100.0000 mg | ORAL_TABLET | Freq: Every day | ORAL | 1 refills | Status: DC
Start: 2023-03-01 — End: 2023-11-04

## 2023-03-01 NOTE — Progress Notes (Signed)
[facility]  Patient ID: Brian Maldonado,  MRN: [id number]  PCP: Joaquim Nam, MD  Date of Evaluation: 03/01/2023 Time spent:30 minutes  Virtual Visit via Video Note   I connected with pt @ on 04/20/22 at  9:00 AM EDT by a video enabled telemedicine application and verified that I am speaking with the correct person using two identifiers.   I discussed the limitations of evaluation and management by telemedicine and the availability of in person appointments. The patient expressed understanding and agreed to proceed.   I discussed the assessment and treatment plan with the patient. The patient was provided an opportunity to ask questions and all were answered. The patient agreed with the plan and demonstrated an understanding of the instructions.   The patient was advised to call back or seek an in-person evaluation if the symptoms worsen or if the condition fails to improve as anticipated.   I provided 30 minutes of non-face-to-face time during this encounter.  The patient was located at home.  The provider was located at [facility].     Joan Flores, NP    Chief Complaint:   HISTORY/CURRENT STATUS: HPI  Brian Maldonado, 37 year old male presents to this office via video visit for follow up and medication management. Says that he and his wife were having some issues but now in marriage counseling.   He says that he continues to noticed significant improvement with anxiety and depression since last visit. He feels like his Adderall is not working as well and would like to discuss today. Says his anxiety today is 1/10 and depression is 0/10. He is sleeping 7-8 hours per night. No mania, no psychosis, No SI/HI. Request f/u every 6 months.    No previous psychiatric medication failures  Individual Medical History/ Review of Systems: Changes? :No   Allergies: Sulfa antibiotics  Current Medications:  Current Outpatient Medications:    [START ON  03/12/2023] amphetamine-dextroamphetamine (ADDERALL) 20 MG tablet, Take 1 tablet (20 mg total) by mouth 2 (two) times daily., Disp: 60 tablet, Rfl: 0   albuterol (VENTOLIN HFA) 108 (90 Base) MCG/ACT inhaler, Inhale 2 puffs into the lungs every 6 (six) hours as needed for wheezing or shortness of breath. (Patient not taking: Reported on 12/01/2022), Disp: 8 g, Rfl: 0   [START ON 03/07/2023] amphetamine-dextroamphetamine (ADDERALL) 15 MG tablet, Take 1 tablet by mouth 2 (two) times daily., Disp: 60 tablet, Rfl: 0   amphetamine-dextroamphetamine (ADDERALL) 15 MG tablet, Take 1 tablet by mouth 2 (two) times daily., Disp: 60 tablet, Rfl: 0   amphetamine-dextroamphetamine (ADDERALL) 15 MG tablet, Take 1 tablet by mouth 2 (two) times daily., Disp: 60 tablet, Rfl: 0   Azelastine HCl (ASTEPRO) 0.15 % SOLN, Place 1 spray into the nose every morning., Disp: , Rfl:    benzonatate (TESSALON) 100 MG capsule, Take 1 capsule (100 mg total) by mouth 3 (three) times daily as needed. (Patient not taking: Reported on 12/01/2022), Disp: 30 capsule, Rfl: 0   fluticasone (FLONASE) 50 MCG/ACT nasal spray, Place 1 spray into both nostrils at bedtime., Disp: 16 g, Rfl: 2   Glutamine 500 MG TABS, Take by mouth every morning., Disp: , Rfl:    Multiple Vitamin (MULTIVITAMIN) tablet, Take 1 tablet by mouth daily., Disp: , Rfl:    oseltamivir (TAMIFLU) 75 MG capsule, Take 1 capsule (75 mg total) by mouth 2 (two) times daily. (Patient not taking: Reported on 12/01/2022), Disp: 10 capsule, Rfl: 0   OVER  THE COUNTER MEDICATION, [place] Amino Acids - after workouts., Disp: , Rfl:    promethazine-dextromethorphan (PROMETHAZINE-DM) 6.25-15 MG/5ML syrup, Take 5 mLs by mouth 4 (four) times daily as needed. (Patient not taking: Reported on 12/01/2022), Disp: 118 mL, Rfl: 0   rOPINIRole (REQUIP) 1 MG tablet, Take 1 tablet (1 mg total) by mouth at bedtime., Disp: 30 tablet, Rfl: 1   sertraline (ZOLOFT) 100 MG tablet, Take 1 tablet (100 mg total) by  mouth daily., Disp: 90 tablet, Rfl: 1   vitamin B-12 (CYANOCOBALAMIN) 1000 MCG tablet, Take 1,000 mcg by mouth daily., Disp: , Rfl:  Medication Side Effects: none  Family Medical/ Social History: Changes? No  MENTAL HEALTH EXAM:  There were no vitals taken for this visit.There is no height or weight on file to calculate BMI.  General Appearance: Casual, Neat, and Well Groomed  Eye Contact:  Good  Speech:  Clear and Coherent  Volume:  Normal  Mood:  NA  Affect:  Appropriate  Thought Process:  Coherent  Orientation:  Full (Time, Place, and Person)  Thought Content: Logical   Suicidal Thoughts:  No  Homicidal Thoughts:  No  Memory:  WNL  Judgement:  Good  Insight:  Good  Psychomotor Activity:  Normal  Concentration:  Concentration: Good  Recall:  Good  Fund of Knowledge: Good  Language: Good  Assets:  Desire for Improvement  ADL's:  Intact  Cognition: WNL  Prognosis:  Good    DIAGNOSES:    ICD-10-CM   1. Generalized anxiety disorder  F41.1 sertraline (ZOLOFT) 100 MG tablet    2. Attention deficit hyperactivity disorder (ADHD), combined type  F90.2 amphetamine-dextroamphetamine (ADDERALL) 20 MG tablet    sertraline (ZOLOFT) 100 MG tablet    3. Other depression  F32.89 sertraline (ZOLOFT) 100 MG tablet      Receiving Psychotherapy: No    RECOMMENDATIONS:   Greater than 50% of  30 min video visit with patient was spent on counseling and coordination of care. We discussed his continued significant improvement  with anxiety and depression. We discussed his concerns that his Adderall is not working as well recently. I did notice that he presents more scattered this visit and agree it may be necessary to adjust his dose.  Reports a 90%  improvement with hair pulling. He does not want to adjust medications at this time. He is continuing in psychotherapy monthly.    We agreed to:   To increase Adderall 20 mg twice daily  To Continue Zoloft to 100 mg daily Continue NAC 3600  mg daily To take with a little food to help with any nausea. Will report side effects or worsening symptoms To follow up in 6 months to reassess per pt. Provided emergency contact information Reviewed PDMP    Joan Flores, NP

## 2023-03-07 DIAGNOSIS — F900 Attention-deficit hyperactivity disorder, predominantly inattentive type: Secondary | ICD-10-CM | POA: Diagnosis not present

## 2023-03-07 DIAGNOSIS — F1921 Other psychoactive substance dependence, in remission: Secondary | ICD-10-CM | POA: Diagnosis not present

## 2023-03-07 DIAGNOSIS — F411 Generalized anxiety disorder: Secondary | ICD-10-CM | POA: Diagnosis not present

## 2023-03-08 DIAGNOSIS — F1921 Other psychoactive substance dependence, in remission: Secondary | ICD-10-CM | POA: Diagnosis not present

## 2023-03-08 DIAGNOSIS — F900 Attention-deficit hyperactivity disorder, predominantly inattentive type: Secondary | ICD-10-CM | POA: Diagnosis not present

## 2023-03-08 DIAGNOSIS — F411 Generalized anxiety disorder: Secondary | ICD-10-CM | POA: Diagnosis not present

## 2023-03-21 DIAGNOSIS — F900 Attention-deficit hyperactivity disorder, predominantly inattentive type: Secondary | ICD-10-CM | POA: Diagnosis not present

## 2023-03-21 DIAGNOSIS — F411 Generalized anxiety disorder: Secondary | ICD-10-CM | POA: Diagnosis not present

## 2023-03-21 DIAGNOSIS — F1921 Other psychoactive substance dependence, in remission: Secondary | ICD-10-CM | POA: Diagnosis not present

## 2023-04-04 DIAGNOSIS — F900 Attention-deficit hyperactivity disorder, predominantly inattentive type: Secondary | ICD-10-CM | POA: Diagnosis not present

## 2023-04-04 DIAGNOSIS — F1921 Other psychoactive substance dependence, in remission: Secondary | ICD-10-CM | POA: Diagnosis not present

## 2023-04-04 DIAGNOSIS — F411 Generalized anxiety disorder: Secondary | ICD-10-CM | POA: Diagnosis not present

## 2023-04-04 DIAGNOSIS — G4733 Obstructive sleep apnea (adult) (pediatric): Secondary | ICD-10-CM | POA: Diagnosis not present

## 2023-04-08 ENCOUNTER — Other Ambulatory Visit: Payer: Self-pay | Admitting: Behavioral Health

## 2023-04-08 DIAGNOSIS — F902 Attention-deficit hyperactivity disorder, combined type: Secondary | ICD-10-CM

## 2023-04-18 DIAGNOSIS — F1921 Other psychoactive substance dependence, in remission: Secondary | ICD-10-CM | POA: Diagnosis not present

## 2023-04-18 DIAGNOSIS — F411 Generalized anxiety disorder: Secondary | ICD-10-CM | POA: Diagnosis not present

## 2023-04-18 DIAGNOSIS — F900 Attention-deficit hyperactivity disorder, predominantly inattentive type: Secondary | ICD-10-CM | POA: Diagnosis not present

## 2023-04-25 DIAGNOSIS — F900 Attention-deficit hyperactivity disorder, predominantly inattentive type: Secondary | ICD-10-CM | POA: Diagnosis not present

## 2023-04-25 DIAGNOSIS — F411 Generalized anxiety disorder: Secondary | ICD-10-CM | POA: Diagnosis not present

## 2023-04-25 DIAGNOSIS — F1921 Other psychoactive substance dependence, in remission: Secondary | ICD-10-CM | POA: Diagnosis not present

## 2023-05-09 DIAGNOSIS — F1921 Other psychoactive substance dependence, in remission: Secondary | ICD-10-CM | POA: Diagnosis not present

## 2023-05-09 DIAGNOSIS — F411 Generalized anxiety disorder: Secondary | ICD-10-CM | POA: Diagnosis not present

## 2023-05-09 DIAGNOSIS — F900 Attention-deficit hyperactivity disorder, predominantly inattentive type: Secondary | ICD-10-CM | POA: Diagnosis not present

## 2023-05-12 ENCOUNTER — Telehealth: Payer: Self-pay | Admitting: Behavioral Health

## 2023-05-12 NOTE — Telephone Encounter (Signed)
Patient lvm requesting a refill on the Adderall 20 mg. Last seen on 03/01/23, with no follow up scheduled.

## 2023-05-12 NOTE — Telephone Encounter (Signed)
Patient has RF available for Nov and Dec at [facility]. Notified him.

## 2023-05-16 DIAGNOSIS — F1921 Other psychoactive substance dependence, in remission: Secondary | ICD-10-CM | POA: Diagnosis not present

## 2023-05-16 DIAGNOSIS — F411 Generalized anxiety disorder: Secondary | ICD-10-CM | POA: Diagnosis not present

## 2023-05-16 DIAGNOSIS — F900 Attention-deficit hyperactivity disorder, predominantly inattentive type: Secondary | ICD-10-CM | POA: Diagnosis not present

## 2023-06-06 DIAGNOSIS — M5412 Radiculopathy, cervical region: Secondary | ICD-10-CM | POA: Diagnosis not present

## 2023-06-13 DIAGNOSIS — F1921 Other psychoactive substance dependence, in remission: Secondary | ICD-10-CM | POA: Diagnosis not present

## 2023-06-13 DIAGNOSIS — F411 Generalized anxiety disorder: Secondary | ICD-10-CM | POA: Diagnosis not present

## 2023-06-13 DIAGNOSIS — F900 Attention-deficit hyperactivity disorder, predominantly inattentive type: Secondary | ICD-10-CM | POA: Diagnosis not present

## 2023-07-07 ENCOUNTER — Other Ambulatory Visit: Payer: Self-pay | Admitting: Behavioral Health

## 2023-07-07 DIAGNOSIS — F902 Attention-deficit hyperactivity disorder, combined type: Secondary | ICD-10-CM

## 2023-07-08 NOTE — Telephone Encounter (Signed)
Due 1/13

## 2023-07-25 DIAGNOSIS — F1921 Other psychoactive substance dependence, in remission: Secondary | ICD-10-CM | POA: Diagnosis not present

## 2023-07-25 DIAGNOSIS — F411 Generalized anxiety disorder: Secondary | ICD-10-CM | POA: Diagnosis not present

## 2023-07-25 DIAGNOSIS — F900 Attention-deficit hyperactivity disorder, predominantly inattentive type: Secondary | ICD-10-CM | POA: Diagnosis not present

## 2023-08-08 DIAGNOSIS — F1921 Other psychoactive substance dependence, in remission: Secondary | ICD-10-CM | POA: Diagnosis not present

## 2023-08-08 DIAGNOSIS — F411 Generalized anxiety disorder: Secondary | ICD-10-CM | POA: Diagnosis not present

## 2023-08-08 DIAGNOSIS — F900 Attention-deficit hyperactivity disorder, predominantly inattentive type: Secondary | ICD-10-CM | POA: Diagnosis not present

## 2023-08-09 ENCOUNTER — Other Ambulatory Visit: Payer: Self-pay | Admitting: Behavioral Health

## 2023-08-22 DIAGNOSIS — F1921 Other psychoactive substance dependence, in remission: Secondary | ICD-10-CM | POA: Diagnosis not present

## 2023-08-22 DIAGNOSIS — F411 Generalized anxiety disorder: Secondary | ICD-10-CM | POA: Diagnosis not present

## 2023-08-22 DIAGNOSIS — F900 Attention-deficit hyperactivity disorder, predominantly inattentive type: Secondary | ICD-10-CM | POA: Diagnosis not present

## 2023-09-05 DIAGNOSIS — F1921 Other psychoactive substance dependence, in remission: Secondary | ICD-10-CM | POA: Diagnosis not present

## 2023-09-05 DIAGNOSIS — F411 Generalized anxiety disorder: Secondary | ICD-10-CM | POA: Diagnosis not present

## 2023-09-05 DIAGNOSIS — F900 Attention-deficit hyperactivity disorder, predominantly inattentive type: Secondary | ICD-10-CM | POA: Diagnosis not present

## 2023-09-06 ENCOUNTER — Other Ambulatory Visit: Payer: Self-pay | Admitting: Behavioral Health

## 2023-10-05 ENCOUNTER — Other Ambulatory Visit: Payer: Self-pay | Admitting: Behavioral Health

## 2023-10-24 DIAGNOSIS — F900 Attention-deficit hyperactivity disorder, predominantly inattentive type: Secondary | ICD-10-CM | POA: Diagnosis not present

## 2023-10-24 DIAGNOSIS — F1921 Other psychoactive substance dependence, in remission: Secondary | ICD-10-CM | POA: Diagnosis not present

## 2023-10-24 DIAGNOSIS — F411 Generalized anxiety disorder: Secondary | ICD-10-CM | POA: Diagnosis not present

## 2023-11-01 ENCOUNTER — Other Ambulatory Visit: Payer: Self-pay | Admitting: Behavioral Health

## 2023-11-03 NOTE — Telephone Encounter (Signed)
 Sent MyChart message. Was due for FU in March.

## 2023-11-03 NOTE — Telephone Encounter (Signed)
 Had appt at 8:30 tomorrow. Provider can address RF at that time.

## 2023-11-04 ENCOUNTER — Telehealth: Admitting: Behavioral Health

## 2023-11-04 ENCOUNTER — Telehealth: Payer: Self-pay | Admitting: Behavioral Health

## 2023-11-04 ENCOUNTER — Encounter: Payer: Self-pay | Admitting: Behavioral Health

## 2023-11-04 DIAGNOSIS — F411 Generalized anxiety disorder: Secondary | ICD-10-CM

## 2023-11-04 DIAGNOSIS — F3289 Other specified depressive episodes: Secondary | ICD-10-CM | POA: Diagnosis not present

## 2023-11-04 DIAGNOSIS — F902 Attention-deficit hyperactivity disorder, combined type: Secondary | ICD-10-CM

## 2023-11-04 MED ORDER — SERTRALINE HCL 100 MG PO TABS: 100.0000 mg | ORAL_TABLET | Freq: Every day | ORAL | 1 refills | Status: DC

## 2023-11-04 MED ORDER — AMPHETAMINE-DEXTROAMPHETAMINE 20 MG PO TABS: 20.0000 mg | ORAL_TABLET | Freq: Every day | ORAL | 0 refills | Status: DC

## 2023-11-04 MED ORDER — AMPHETAMINE-DEXTROAMPHETAMINE 30 MG PO TABS: 30.0000 mg | ORAL_TABLET | Freq: Every day | ORAL | 0 refills | Status: DC

## 2023-11-04 NOTE — Telephone Encounter (Signed)
Clarified dosing with pharmacist.

## 2023-11-04 NOTE — Telephone Encounter (Signed)
 Brian Maldonado with [facility] called needing clarification on the two Adderall  prescriptions for 20mg  1 twice daily and the 30mg  1 daily.  This is a change and they want more details before they dispense the medication.  Please call [phone]

## 2023-11-04 NOTE — Progress Notes (Signed)
 Brian Maldonado [id number] [birth year] 38 y.o.  Virtual Visit via Video Note  I connected with pt @ on 11/04/23 at  8:30 AM EDT by a video enabled telemedicine application and verified that I am speaking with the correct person using two identifiers.   I discussed the limitations of evaluation and management by telemedicine and the availability of in person appointments. The patient expressed understanding and agreed to proceed.  I discussed the assessment and treatment plan with the patient. The patient was provided an opportunity to ask questions and all were answered. The patient agreed with the plan and demonstrated an understanding of the instructions.   The patient was advised to call back or seek an in-person evaluation if the symptoms worsen or if the condition fails to improve as anticipated.  I provided 30 minutes of non-face-to-face time during this encounter.  The patient was located at home.  The provider was located at [facility].   Lincoln Renshaw, NP   Subjective:   Patient ID:  Brian Maldonado is a 38 y.o. (DOB Nov 19, 1985) male.  Chief Complaint: No chief complaint on file.   HPI  Brian Maldonado, 38 year old male presents to this office via video visit for follow up and medication management. Says that marriage has improved. He is continuing in individual counseling but stopped the marriage counseling. He is now having some problems at work in the am not being able to focus. 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 He would like to adjust his Adderall  this visit.   Says his anxiety today is 1/10 and depression is 0/10. He is sleeping 7-8 hours per night. No mania, no psychosis, No SI/HI. Request f/u every 6 months.    No previous psychiatric medication failures   Review of Systems:  Review of Systems  Constitutional: Negative.   Allergic/Immunologic: Negative.   Neurological: Negative.   Psychiatric/Behavioral:   Positive for dysphoric mood.     Medications: I have reviewed the patient's current medications.  Current Outpatient Medications  Medication Sig Dispense Refill   amphetamine -dextroamphetamine  (ADDERALL ) 20 MG tablet Take 1 tablet (20 mg total) by mouth daily. 30 tablet 0   amphetamine -dextroamphetamine  (ADDERALL ) 30 MG tablet Take 1 tablet by mouth daily. 30 tablet 0   albuterol  (VENTOLIN  HFA) 108 (90 Base) MCG/ACT inhaler Inhale 2 puffs into the lungs every 6 (six) hours as needed for wheezing or shortness of breath. (Patient not taking: Reported on 12/01/2022) 8 g 0   amphetamine -dextroamphetamine  (ADDERALL ) 20 MG tablet TAKE ONE TABLET (20 MG) BY MOUTH TWICE DAILY 60 tablet 0   amphetamine -dextroamphetamine  (ADDERALL ) 20 MG tablet Take 1 tablet (20 mg total) by mouth 2 (two) times daily. 60 tablet 0   amphetamine -dextroamphetamine  (ADDERALL ) 20 MG tablet TAKE ONE (1) TABLET BY MOUTH TWO TIMES PER DAY 60 tablet 0   amphetamine -dextroamphetamine  (ADDERALL ) 20 MG tablet TAKE ONE TABLET (20 MG) BY MOUTH TWICE DAILY 60 tablet 0   amphetamine -dextroamphetamine  (ADDERALL ) 20 MG tablet TAKE 1 TABLET BY MOUTH TWICE DAILY 60 tablet 0   amphetamine -dextroamphetamine  (ADDERALL ) 20 MG tablet TAKE 1 TABLET BY MOUTH TWICE DAILY - NEED APPOINTMENT 60 tablet 0   Azelastine  HCl (ASTEPRO ) 0.15 % SOLN Place 1 spray into the nose every morning.     benzonatate  (TESSALON ) 100 MG capsule Take 1 capsule (100 mg total) by mouth 3 (three) times daily as needed. (Patient not taking: Reported on 12/01/2022) 30 capsule 0  fluticasone  (FLONASE ) 50 MCG/ACT nasal spray Place 1 spray into both nostrils at bedtime. 16 g 2   Glutamine 500 MG TABS Take by mouth every morning.     Multiple Vitamin (MULTIVITAMIN) tablet Take 1 tablet by mouth daily.     oseltamivir  (TAMIFLU ) 75 MG capsule Take 1 capsule (75 mg total) by mouth 2 (two) times daily. (Patient not taking: Reported on 12/01/2022) 10 capsule 0   OVER THE COUNTER MEDICATION  Branch Chain Amino Acids - after workouts.     promethazine -dextromethorphan (PROMETHAZINE -DM) 6.25-15 MG/5ML syrup Take 5 mLs by mouth 4 (four) times daily as needed. (Patient not taking: Reported on 12/01/2022) 118 mL 0   rOPINIRole  (REQUIP ) 1 MG tablet Take 1 tablet (1 mg total) by mouth at bedtime. 30 tablet 1   sertraline  (ZOLOFT ) 100 MG tablet Take 1 tablet (100 mg total) by mouth daily. 90 tablet 1   vitamin B-12 (CYANOCOBALAMIN) 1000 MCG tablet Take 1,000 mcg by mouth daily.     No current facility-administered medications for this visit.    Medication Side Effects: None  Allergies:  Allergies  Allergen Reactions   Sulfa Antibiotics     Unknown;  Had as a child and does not recall reaction    Past Medical History:  Diagnosis Date   Cervicogenic headache 07/01/2015   Left side   GERD (gastroesophageal reflux disease)    Headache    Left shoulder pain    S/P dilatation of esophageal stricture    Sleep apnea 2018    Family History  Problem Relation Age of Onset   Drug abuse Mother    Sleep apnea Mother    Migraines Mother    Drug abuse Father    ADD / ADHD Father    Diabetes Father    Sleep apnea Father    Prostate cancer Paternal Uncle    Heart disease Paternal Grandfather    Prostate cancer Paternal Grandfather    Colon cancer Neg Hx     Social History   Socioeconomic History   Marital status: Married    Spouse name: Not on file   Number of children: 1   Years of education: some coll.   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    Patient is left handed.    Married 2012   Daughter [name] born 2017   From [place].  Working for [employer].     Enjoys playing bass guitar.     Social Drivers of Corporate investment banker Strain: Not on file  Food Insecurity: Not on file  Transportation Needs: Not on file  Physical Activity: Not on file  Stress: Not on file  Social Connections: Not on file  Intimate Partner Violence: Not on file    Past Medical History, Surgical history, Social history, and Family history were reviewed and updated as appropriate.   Please see review of systems for further details on the patient's review from today.   Objective:   Physical  Exam:  There were no vitals taken for this visit.  Physical Exam Constitutional:      General: He is not in acute distress.    Appearance: Normal appearance.  Neurological:     Mental Status: He is alert and oriented to person, place, and time.     Gait: Gait normal.  Psychiatric:        Attention and Perception: Attention and perception normal. He does not perceive auditory or visual hallucinations.        Mood and Affect: Mood and affect normal. Mood is not anxious or depressed. Affect is not labile.        Speech: Speech normal.        Behavior: Behavior normal. Behavior is cooperative.        Thought Content: Thought content normal.        Cognition and Memory: Cognition and memory normal.        Judgment: Judgment normal.     Lab Review:     Component Value Date/Time   NA 139 02/03/2022 1558   NA 140 04/07/2013 1842   K 4.0 02/03/2022 1558   K 4.0 04/07/2013 1842   CL 99 02/03/2022 1558   CL 108 (H) 04/07/2013 1842   CO2 25 02/03/2022 1558   CO2 28 04/07/2013 1842   GLUCOSE 107 (H) 02/03/2022 1558   GLUCOSE 140 (H) 04/07/2013 1842   BUN 30 (H) 02/03/2022 1558   BUN 15 04/07/2013 1842   CREATININE 1.07 02/03/2022 1558   CREATININE 1.14 04/07/2013 1842   CALCIUM 9.3 02/03/2022 1558   CALCIUM 9.9  04/07/2013 1842   GFRNONAA >60 07/10/2018 2033   GFRNONAA >60 04/07/2013 1842   GFRAA >60 07/10/2018 2033   GFRAA >60 04/07/2013 1842       Component Value Date/Time   WBC 5.1 02/03/2022 1558   RBC 4.54 02/03/2022 1558   HGB 14.2 02/03/2022 1558   HGB 15.1 04/07/2013 1842   HCT 40.9 02/03/2022 1558   HCT 42.6 04/07/2013 1842   PLT 186.0 02/03/2022 1558   PLT 186 04/07/2013 1842   MCV 90.0 02/03/2022 1558   MCV 90 04/07/2013 1842   MCH 31.4 07/10/2018 2033   MCHC 34.7 02/03/2022 1558   RDW 12.4 02/03/2022 1558   RDW 12.4 04/07/2013 1842   LYMPHSABS 1.2 02/03/2022 1558   MONOABS 0.5 02/03/2022 1558   EOSABS 0.2 02/03/2022 1558   BASOSABS 0.0 02/03/2022 1558    No results found for: "POCLITH", "LITHIUM"   No results found for: "PHENYTOIN", "PHENOBARB", "VALPROATE", "CBMZ"   .res Assessment: Plan:    Greater than 50% of  30 min video visit with patient was spent on counseling and coordination of care. Discussed his recent decline with attention and focus in the am at work.  I did notice that he presents more scattered this visit and agree it may be necessary to adjust his dose.  Reports a 90%  improvement with hair pulling. He does not want to adjust medications at this time. He is continuing in psychotherapy monthly.    We agreed to:    To increase Adderall  to 30 mg in the am and 20 mg in the afternoon for boost To Continue Zoloft  to 100 mg daily Continue NAC 3600 mg daily To take with a little food to help with any nausea. Will report side effects or worsening symptoms To follow up in 6 months to reassess per pt. Provided emergency contact information Reviewed PDMP  Polly Brink A. Saleh Ulbrich,  NP   Diagnoses and all orders for this visit:  Attention deficit hyperactivity disorder (ADHD), combined type -     amphetamine -dextroamphetamine  (ADDERALL ) 30 MG tablet; Take 1 tablet by mouth daily. -     amphetamine -dextroamphetamine  (ADDERALL ) 20 MG tablet; Take 1 tablet (20 mg  total) by mouth daily. -     sertraline  (ZOLOFT ) 100 MG tablet; Take 1 tablet (100 mg total) by mouth daily.  Generalized anxiety disorder -     amphetamine -dextroamphetamine  (ADDERALL ) 20 MG tablet; Take 1 tablet (20 mg total) by mouth daily. -     sertraline  (ZOLOFT ) 100 MG tablet; Take 1 tablet (100 mg total) by mouth daily.  Other depression -     sertraline  (ZOLOFT ) 100 MG tablet; Take 1 tablet (100 mg total) by mouth daily.     Please see After Visit Summary for patient specific instructions.  No future appointments.  No orders of the defined types were placed in this encounter.     -------------------------------

## 2023-11-04 NOTE — Telephone Encounter (Signed)
 Pharmacy is questioning Adderall  dose. He was previously taking 20 mg BID and Rx today was for one a day. The 30 mg dose is not in question. Please verify.

## 2023-11-04 NOTE — Telephone Encounter (Signed)
 I changed because we are increasing his morning dose of Adderall  to 30 mg, and keeping a single 20 mg dose for the afternoon only.

## 2023-11-29 ENCOUNTER — Other Ambulatory Visit: Payer: Self-pay | Admitting: Behavioral Health

## 2023-11-29 DIAGNOSIS — F902 Attention-deficit hyperactivity disorder, combined type: Secondary | ICD-10-CM

## 2023-11-29 DIAGNOSIS — F411 Generalized anxiety disorder: Secondary | ICD-10-CM

## 2023-11-29 NOTE — Telephone Encounter (Signed)
 LF 5/9, due 6/6

## 2023-12-02 MED ORDER — AMPHETAMINE-DEXTROAMPHETAMINE 20 MG PO TABS: 20.0000 mg | ORAL_TABLET | Freq: Every day | ORAL | 0 refills | Status: DC

## 2023-12-02 MED ORDER — AMPHETAMINE-DEXTROAMPHETAMINE 20 MG PO TABS: 20.0000 mg | ORAL_TABLET | Freq: Every day | ORAL | 0 refills | Status: AC

## 2023-12-19 DIAGNOSIS — F1921 Other psychoactive substance dependence, in remission: Secondary | ICD-10-CM | POA: Diagnosis not present

## 2023-12-19 DIAGNOSIS — F411 Generalized anxiety disorder: Secondary | ICD-10-CM | POA: Diagnosis not present

## 2023-12-19 DIAGNOSIS — F900 Attention-deficit hyperactivity disorder, predominantly inattentive type: Secondary | ICD-10-CM | POA: Diagnosis not present

## 2024-02-10 DIAGNOSIS — F411 Generalized anxiety disorder: Secondary | ICD-10-CM | POA: Diagnosis not present

## 2024-02-10 DIAGNOSIS — F900 Attention-deficit hyperactivity disorder, predominantly inattentive type: Secondary | ICD-10-CM | POA: Diagnosis not present

## 2024-02-10 DIAGNOSIS — F1921 Other psychoactive substance dependence, in remission: Secondary | ICD-10-CM | POA: Diagnosis not present

## 2024-02-24 DIAGNOSIS — F411 Generalized anxiety disorder: Secondary | ICD-10-CM | POA: Diagnosis not present

## 2024-02-24 DIAGNOSIS — F1921 Other psychoactive substance dependence, in remission: Secondary | ICD-10-CM | POA: Diagnosis not present

## 2024-02-24 DIAGNOSIS — F900 Attention-deficit hyperactivity disorder, predominantly inattentive type: Secondary | ICD-10-CM | POA: Diagnosis not present

## 2024-03-01 ENCOUNTER — Other Ambulatory Visit: Payer: Self-pay | Admitting: Adult Health

## 2024-03-01 DIAGNOSIS — F902 Attention-deficit hyperactivity disorder, combined type: Secondary | ICD-10-CM

## 2024-03-15 DIAGNOSIS — F411 Generalized anxiety disorder: Secondary | ICD-10-CM | POA: Diagnosis not present

## 2024-03-15 DIAGNOSIS — F900 Attention-deficit hyperactivity disorder, predominantly inattentive type: Secondary | ICD-10-CM | POA: Diagnosis not present

## 2024-03-15 DIAGNOSIS — F1921 Other psychoactive substance dependence, in remission: Secondary | ICD-10-CM | POA: Diagnosis not present

## 2024-03-29 DIAGNOSIS — F900 Attention-deficit hyperactivity disorder, predominantly inattentive type: Secondary | ICD-10-CM | POA: Diagnosis not present

## 2024-03-29 DIAGNOSIS — F1921 Other psychoactive substance dependence, in remission: Secondary | ICD-10-CM | POA: Diagnosis not present

## 2024-04-02 ENCOUNTER — Other Ambulatory Visit: Payer: Self-pay | Admitting: Behavioral Health

## 2024-04-02 DIAGNOSIS — F902 Attention-deficit hyperactivity disorder, combined type: Secondary | ICD-10-CM

## 2024-04-09 DIAGNOSIS — G4733 Obstructive sleep apnea (adult) (pediatric): Secondary | ICD-10-CM | POA: Diagnosis not present

## 2024-04-10 DIAGNOSIS — F1921 Other psychoactive substance dependence, in remission: Secondary | ICD-10-CM | POA: Diagnosis not present

## 2024-04-10 DIAGNOSIS — F900 Attention-deficit hyperactivity disorder, predominantly inattentive type: Secondary | ICD-10-CM | POA: Diagnosis not present

## 2024-04-24 DIAGNOSIS — F1921 Other psychoactive substance dependence, in remission: Secondary | ICD-10-CM | POA: Diagnosis not present

## 2024-04-24 DIAGNOSIS — F900 Attention-deficit hyperactivity disorder, predominantly inattentive type: Secondary | ICD-10-CM | POA: Diagnosis not present

## 2024-04-28 ENCOUNTER — Other Ambulatory Visit: Payer: Self-pay | Admitting: Behavioral Health

## 2024-04-28 DIAGNOSIS — F902 Attention-deficit hyperactivity disorder, combined type: Secondary | ICD-10-CM

## 2024-04-29 DIAGNOSIS — S93402A Sprain of unspecified ligament of left ankle, initial encounter: Secondary | ICD-10-CM | POA: Diagnosis not present

## 2024-05-01 NOTE — Telephone Encounter (Signed)
 Pt called at 9:41a requesting refill of Adderall  20mg  and 30mg  to   [facility] - [place], [place] - [address] [place], [place] [address] Phone: [phone]  Fax: [phone]    Next appt 11/20

## 2024-05-01 NOTE — Telephone Encounter (Signed)
 Sent MyChart note, due now.

## 2024-05-10 DIAGNOSIS — F1921 Other psychoactive substance dependence, in remission: Secondary | ICD-10-CM | POA: Diagnosis not present

## 2024-05-10 DIAGNOSIS — F900 Attention-deficit hyperactivity disorder, predominantly inattentive type: Secondary | ICD-10-CM | POA: Diagnosis not present

## 2024-05-17 ENCOUNTER — Telehealth (INDEPENDENT_AMBULATORY_CARE_PROVIDER_SITE_OTHER): Admitting: Behavioral Health

## 2024-05-17 ENCOUNTER — Encounter: Payer: Self-pay | Admitting: Behavioral Health

## 2024-05-17 DIAGNOSIS — F411 Generalized anxiety disorder: Secondary | ICD-10-CM

## 2024-05-17 DIAGNOSIS — F902 Attention-deficit hyperactivity disorder, combined type: Secondary | ICD-10-CM | POA: Diagnosis not present

## 2024-05-17 DIAGNOSIS — F3289 Other specified depressive episodes: Secondary | ICD-10-CM

## 2024-05-17 MED ORDER — SERTRALINE HCL 100 MG PO TABS: 100.0000 mg | ORAL_TABLET | Freq: Every day | ORAL | 1 refills | Status: AC

## 2024-05-17 MED ORDER — AMPHETAMINE-DEXTROAMPHETAMINE 20 MG PO TABS
20.0000 mg | ORAL_TABLET | Freq: Every day | ORAL | 0 refills | Status: AC
Start: 2024-05-30 — End: 2024-06-29

## 2024-05-17 MED ORDER — AMPHETAMINE-DEXTROAMPHETAMINE 30 MG PO TABS
1.0000 | ORAL_TABLET | Freq: Every day | ORAL | 0 refills | Status: AC
Start: 2024-05-30 — End: 2024-06-29

## 2024-05-17 NOTE — Progress Notes (Addendum)
 [facility]  Patient ID: Brian Maldonado,  MRN: [id number]  PCP: Brian Arlyss RAMAN, MD  Date of Evaluation: 05/23/2024 Time spent:30 minutes  Virtual Visit via Video Note   I connected with pt @ on 05/17/24 at  11:00 AM EDT by a video enabled telemedicine application and verified that I am speaking with the correct person using two identifiers.   I discussed the limitations of evaluation and management by telemedicine and the availability of in person appointments. The patient expressed understanding and agreed to proceed.   I discussed the assessment and treatment plan with the patient. The patient was provided an opportunity to ask questions and all were answered. The patient agreed with the plan and demonstrated an understanding of the instructions.   The patient was advised to call back or seek an in-person evaluation if the symptoms worsen or if the condition fails to improve as anticipated.   I provided 30 minutes of non-face-to-face time during this encounter.  The patient was located at home.  The provider was located at [facility].     Brian DELENA Pizza, NP   Chief Complaint:  Chief Complaint   Depression; Anxiety; ADHD; Follow-up; Medication Refill; Patient Education     HISTORY/CURRENT STATUS: HPI  Brian Maldonado, 38 year old male presents to this office via video visit for follow up and medication management. S says that he has been feeling stable overall.   He accidentally took two 30 mg tablets  and it worked well but understands he needs his afternoon dose.  For now he has decided to not adjust medications today.    Says his anxiety today is 3/10 and depression is 0/10. He is sleeping 7-8 hours per night. No mania, no psychosis, No SI/HI. Request f/u every 6 months.    No previous psychiatric medication failures      Individual Medical History/ Review of Systems: Changes? :No   Allergies: Sulfa antibiotics  Current Medications:   Current Outpatient Medications:    albuterol  (VENTOLIN  HFA) 108 (90 Base) MCG/ACT inhaler, Inhale 2 puffs into the lungs every 6 (six) hours as needed for wheezing or shortness of breath. (Patient not taking: Reported on 12/01/2022), Disp: 8 g, Rfl: 0   amphetamine -dextroamphetamine  (ADDERALL ) 20 MG tablet, TAKE 1 TABLET BY MOUTH TWICE DAILY - NEED APPOINTMENT, Disp: 60 tablet, Rfl: 0   amphetamine -dextroamphetamine  (ADDERALL ) 20 MG tablet, Take 1 tablet (20 mg total) by mouth daily., Disp: 30 tablet, Rfl: 0   amphetamine -dextroamphetamine  (ADDERALL ) 20 MG tablet, TAKE 1 TABLET BY MOUTH DAILY, Disp: 30 tablet, Rfl: 0   amphetamine -dextroamphetamine  (ADDERALL ) 20 MG tablet, TAKE 1 TABLET BY MOUTH DAILY, Disp: 30 tablet, Rfl: 0   [START ON 05/30/2024] amphetamine -dextroamphetamine  (ADDERALL ) 20 MG tablet, Take 1 tablet (20 mg total) by mouth daily., Disp: 30 tablet, Rfl: 0   amphetamine -dextroamphetamine  (ADDERALL ) 30 MG tablet, TAKE ONE TABLET BY MOUTH ONCE DAILY AS DIRECTED, Disp: 30 tablet, Rfl: 0   amphetamine -dextroamphetamine  (ADDERALL ) 30 MG tablet, Take 1 tablet by mouth daily., Disp: 30 tablet, Rfl: 0   amphetamine -dextroamphetamine  (ADDERALL ) 30 MG tablet, Take 1 tablet by mouth daily., Disp: 30 tablet, Rfl: 0   amphetamine -dextroamphetamine  (ADDERALL ) 30 MG tablet, TAKE 1 TABLET BY MOUTH ONCE DAILY AS DIRECTED, Disp: 30 tablet, Rfl: 0   amphetamine -dextroamphetamine  (ADDERALL ) 30 MG tablet, TAKE 1 TABLET BY MOUTH ONCE DAILY AS DIRECTED, Disp: 30 tablet, Rfl: 0   [START ON 05/30/2024] amphetamine -dextroamphetamine  (ADDERALL ) 30 MG tablet, Take 1 tablet by mouth  daily. as directed, Disp: 30 tablet, Rfl: 0   Azelastine  HCl (ASTEPRO ) 0.15 % SOLN, Place 1 spray into the nose every morning., Disp: , Rfl:    benzonatate  (TESSALON ) 100 MG capsule, Take 1 capsule (100 mg total) by mouth 3 (three) times daily as needed. (Patient not taking: Reported on 12/01/2022), Disp: 30 capsule, Rfl: 0   fluticasone   (FLONASE ) 50 MCG/ACT nasal spray, Place 1 spray into both nostrils at bedtime., Disp: 16 g, Rfl: 2   Glutamine 500 MG TABS, Take by mouth every morning., Disp: , Rfl:    Multiple Vitamin (MULTIVITAMIN) tablet, Take 1 tablet by mouth daily., Disp: , Rfl:    oseltamivir  (TAMIFLU ) 75 MG capsule, Take 1 capsule (75 mg total) by mouth 2 (two) times daily. (Patient not taking: Reported on 12/01/2022), Disp: 10 capsule, Rfl: 0   OVER THE COUNTER MEDICATION, Branch Chain Amino Acids - after workouts., Disp: , Rfl:    promethazine -dextromethorphan (PROMETHAZINE -DM) 6.25-15 MG/5ML syrup, Take 5 mLs by mouth 4 (four) times daily as needed. (Patient not taking: Reported on 12/01/2022), Disp: 118 mL, Rfl: 0   rOPINIRole  (REQUIP ) 1 MG tablet, Take 1 tablet (1 mg total) by mouth at bedtime., Disp: 30 tablet, Rfl: 1   sertraline  (ZOLOFT ) 100 MG tablet, Take 1 tablet (100 mg total) by mouth daily., Disp: 90 tablet, Rfl: 1   vitamin B-12 (CYANOCOBALAMIN) 1000 MCG tablet, Take 1,000 mcg by mouth daily., Disp: , Rfl:  Medication Side Effects: none  Family Medical/ Social History: Changes? No  MENTAL HEALTH EXAM:  There were no vitals taken for this visit.There is no height or weight on file to calculate BMI.  General Appearance: Casual, Neat, and Well Groomed  Eye Contact:  Good  Speech:  Clear and Coherent  Volume:  Normal  Mood:  Anxious  Affect:  Anxious  Thought Process:  Coherent  Orientation:  Full (Time, Place, and Person)  Thought Content: Logical   Suicidal Thoughts:  No  Homicidal Thoughts:  No  Memory:  WNL  Judgement:  Good  Insight:  Good  Psychomotor Activity:  Normal  Concentration:  Concentration: Good  Recall:  Good  Fund of Knowledge: Good  Language: Good  Assets:  Desire for Improvement  ADL's:  Intact  Cognition: WNL  Prognosis:  Good    DIAGNOSES:    ICD-10-CM   1. Attention deficit hyperactivity disorder (ADHD), combined type  F90.2 sertraline  (ZOLOFT ) 100 MG tablet     amphetamine -dextroamphetamine  (ADDERALL ) 20 MG tablet    amphetamine -dextroamphetamine  (ADDERALL ) 30 MG tablet    2. Generalized anxiety disorder  F41.1 sertraline  (ZOLOFT ) 100 MG tablet    3. Other depression  F32.89 sertraline  (ZOLOFT ) 100 MG tablet      Receiving Psychotherapy: No    RECOMMENDATIONS:  Greater than 50% of  30 min video visit with patient was spent on counseling and coordination of care.  Discussed his overall stability with current medication regimen.  After giving considerable fall he has decided to not adjust his medications today even though he has experienced some slight decline in efficacy with his Adderall .  Reports a 90%  improvement with hair pulling. He does not want to adjust medications at this time. He is continuing in psychotherapy monthly.    We agreed to:   To continue Adderall  to 30 mg in the am and 20 mg in the afternoon for boost To Continue Zoloft  to 100 mg daily Continue NAC 3600 mg daily To take with a little food to  help with any nausea. Will report side effects or worsening symptoms To follow up in 4 months to reassess per pt. Provided emergency contact information Reviewed PDMP   Brian A. Teresa, NP     Brian DELENA Teresa, NP

## 2024-05-31 DIAGNOSIS — F1921 Other psychoactive substance dependence, in remission: Secondary | ICD-10-CM | POA: Diagnosis not present

## 2024-05-31 DIAGNOSIS — F411 Generalized anxiety disorder: Secondary | ICD-10-CM | POA: Diagnosis not present

## 2024-05-31 DIAGNOSIS — F900 Attention-deficit hyperactivity disorder, predominantly inattentive type: Secondary | ICD-10-CM | POA: Diagnosis not present

## 2024-06-12 DIAGNOSIS — F411 Generalized anxiety disorder: Secondary | ICD-10-CM | POA: Diagnosis not present

## 2024-06-12 DIAGNOSIS — F1921 Other psychoactive substance dependence, in remission: Secondary | ICD-10-CM | POA: Diagnosis not present

## 2024-06-12 DIAGNOSIS — F900 Attention-deficit hyperactivity disorder, predominantly inattentive type: Secondary | ICD-10-CM | POA: Diagnosis not present

## 2024-06-22 ENCOUNTER — Telehealth: Admitting: Family Medicine

## 2024-06-22 DIAGNOSIS — Z20828 Contact with and (suspected) exposure to other viral communicable diseases: Secondary | ICD-10-CM | POA: Diagnosis not present

## 2024-06-22 MED ORDER — OSELTAMIVIR PHOSPHATE 75 MG PO CAPS: 75.0000 mg | ORAL_CAPSULE | Freq: Two times a day (BID) | ORAL | 0 refills | Status: AC

## 2024-06-22 NOTE — Progress Notes (Signed)
 E visit for Flu like symptoms   We are sorry that you are not feeling well.  Here is how we plan to help! Based on what you have shared with me it looks like you may have possible exposure to a virus that causes influenza.  Influenza or "the flu" is  an infection caused by a respiratory virus. The flu virus is highly contagious and persons who did not receive their yearly flu vaccination may "catch" the flu from close contact.  We have anti-viral medications to treat the viruses that cause this infection. They are not a "cure" and only shorten the course of the infection. These prescriptions are most effective when they are given within the first 2 days of "flu" symptoms. Antiviral medications are indicated if you have a high risk of complications from the flu. You should  also consider an antiviral medication if you are in close contact with someone who is at risk. These medications can help patients avoid complications from the flu but have side effects that you should know.   Possible side effects from Tamiflu  or oseltamivir  include nausea, vomiting, diarrhea, dizziness, headaches, eye redness, sleep problems or other respiratory symptoms. You should not take Tamiflu  if you have an allergy to oseltamivir  or any to the ingredients in Tamiflu .  Based upon your symptoms and potential risk factors I have prescribed Oseltamivir  (Tamiflu ).  It has been sent to your designated pharmacy.  You will take one 75 mg capsule orally twice a day for the next 5 days.   For nasal congestion, you may use an oral decongestant such as Mucinex D or if you have glaucoma or high blood pressure use plain Mucinex.  Saline nasal spray or nasal drops can help and can safely be used as often as needed for congestion.  If you have a sore or scratchy throat, use a saltwater gargle-  to  teaspoon of salt dissolved in a 4-ounce to 8-ounce glass of warm water.  Gargle the solution for approximately 15-30 seconds and then spit.   It is important not to swallow the solution.  You can also use throat lozenges/cough drops and Chloraseptic spray to help with throat pain or discomfort.  Warm or cold liquids can also be helpful in relieving throat pain.  For headache, pain or general discomfort, you can use Ibuprofen or Tylenol as directed.   Some authorities believe that zinc sprays or the use of Echinacea may shorten the course of your symptoms.   You are to isolate at home until you have been fever-free for at least 24 hours without a fever-reducing medication, and symptoms have been steadily improving for 24 hours.  If you must be around other household members who do not have symptoms, you need to make sure that both you and the family members are masking consistently with a high-quality mask.  If you note any worsening of symptoms despite treatment, please seek an in-person evaluation ASAP. If you note any significant shortness of breath or any chest pain, please seek ED evaluation. Please do not delay care!  ANYONE WHO HAS FLU SYMPTOMS SHOULD: Stay home. The flu is highly contagious and going out or to work exposes others! Be sure to drink plenty of fluids. Water is fine as well as fruit juices, sodas and electrolyte beverages. You may want to stay away from caffeine or alcohol. If you are nauseated, try taking small sips of liquids. How do you know if you are getting enough fluid? Your urine should  be a pale yellow or almost colorless. Get rest. Taking a steamy shower or using a humidifier may help nasal congestion and ease sore throat pain. Using a saline nasal spray works much the same way. Cough drops, hard candies and sore throat lozenges may ease your cough. Line up a caregiver. Have someone check on you regularly.  GET HELP RIGHT AWAY IF: You cannot keep down liquids or your medications. You become short of breath Your fell like you are going to pass out or loose consciousness. Your symptoms persist after you  have completed your treatment plan  MAKE SURE YOU  Understand these instructions. Will watch your condition. Will get help right away if you are not doing well or get worse.  Your e-visit answers were reviewed by a board certified advanced clinical practitioner to complete your personal care plan.  Depending on the condition, your plan could have included both over the counter or prescription medications.  If there is a problem please reply  once you have received a response from your provider.  Your safety is important to us .  If you have drug allergies check your prescription carefully.    You can use MyChart to ask questions about today's visit, request a non-urgent call back, or ask for a work or school excuse for 24 hours related to this e-Visit. If it has been greater than 24 hours you will need to follow up with your provider, or enter a new e-Visit to address those concerns.  You will get an e-mail in the next two days asking about your experience.  I hope that your e-visit has been valuable and will speed your recovery. Thank you for using e-visits.   I have spent 5 minutes in review of e-visit questionnaire, review and updating patient chart, medical decision making and response to patient.   Roosvelt Mater, PA-C

## 2024-06-27 ENCOUNTER — Other Ambulatory Visit: Payer: Self-pay | Admitting: Behavioral Health

## 2024-06-27 DIAGNOSIS — F902 Attention-deficit hyperactivity disorder, combined type: Secondary | ICD-10-CM
